# Patient Record
Sex: Female | Born: 1965 | Race: White | Hispanic: No | Marital: Married | Smoking: Never smoker
Health system: Southern US, Community
[De-identification: ages and names within clinical notes are randomized; demographics above are authoritative.]

## PROBLEM LIST (undated history)

## (undated) DIAGNOSIS — E039 Hypothyroidism, unspecified: Secondary | ICD-10-CM

## (undated) DIAGNOSIS — F329 Major depressive disorder, single episode, unspecified: Secondary | ICD-10-CM

## (undated) DIAGNOSIS — F411 Generalized anxiety disorder: Secondary | ICD-10-CM

## (undated) DIAGNOSIS — M79673 Pain in unspecified foot: Secondary | ICD-10-CM

## (undated) DIAGNOSIS — F909 Attention-deficit hyperactivity disorder, unspecified type: Secondary | ICD-10-CM

## (undated) DIAGNOSIS — I10 Essential (primary) hypertension: Principal | ICD-10-CM

## (undated) HISTORY — DX: Attention-deficit hyperactivity disorder, unspecified type: F90.9

## (undated) HISTORY — DX: Pain in unspecified foot: M79.673

## (undated) HISTORY — DX: Essential (primary) hypertension: I10

## (undated) HISTORY — DX: Generalized anxiety disorder: F41.1

## (undated) HISTORY — DX: Major depressive disorder, single episode, unspecified: F32.9

## (undated) HISTORY — DX: Hypothyroidism, unspecified: E03.9

---

## 1999-02-17 ENCOUNTER — Other Ambulatory Visit: Admission: RE | Admit: 1999-02-17 | Discharge: 1999-02-17 | Payer: Self-pay

## 1999-08-27 ENCOUNTER — Inpatient Hospital Stay (HOSPITAL_COMMUNITY): Admission: AD | Admit: 1999-08-27 | Discharge: 1999-08-29 | Payer: Self-pay | Admitting: Obstetrics & Gynecology

## 1999-10-09 ENCOUNTER — Other Ambulatory Visit: Admission: RE | Admit: 1999-10-09 | Discharge: 1999-10-09 | Payer: Self-pay | Admitting: Obstetrics & Gynecology

## 2002-02-02 ENCOUNTER — Encounter: Admission: RE | Admit: 2002-02-02 | Discharge: 2002-02-02 | Payer: Self-pay | Admitting: Obstetrics & Gynecology

## 2002-02-02 ENCOUNTER — Encounter: Payer: Self-pay | Admitting: Obstetrics & Gynecology

## 2002-02-22 ENCOUNTER — Ambulatory Visit (HOSPITAL_COMMUNITY): Admission: RE | Admit: 2002-02-22 | Discharge: 2002-02-22 | Payer: Self-pay | Admitting: Obstetrics & Gynecology

## 2002-02-22 ENCOUNTER — Encounter (INDEPENDENT_AMBULATORY_CARE_PROVIDER_SITE_OTHER): Payer: Self-pay

## 2002-05-11 ENCOUNTER — Other Ambulatory Visit: Admission: RE | Admit: 2002-05-11 | Discharge: 2002-05-11 | Payer: Self-pay | Admitting: Obstetrics & Gynecology

## 2002-09-20 ENCOUNTER — Encounter (INDEPENDENT_AMBULATORY_CARE_PROVIDER_SITE_OTHER): Payer: Self-pay

## 2002-09-20 ENCOUNTER — Observation Stay (HOSPITAL_COMMUNITY): Admission: RE | Admit: 2002-09-20 | Discharge: 2002-09-21 | Payer: Self-pay | Admitting: Obstetrics & Gynecology

## 2004-08-24 ENCOUNTER — Ambulatory Visit (HOSPITAL_COMMUNITY): Admission: RE | Admit: 2004-08-24 | Discharge: 2004-08-24 | Payer: Self-pay | Admitting: Obstetrics & Gynecology

## 2004-10-08 ENCOUNTER — Ambulatory Visit: Payer: Self-pay | Admitting: Internal Medicine

## 2005-01-01 ENCOUNTER — Ambulatory Visit: Payer: Self-pay | Admitting: Internal Medicine

## 2005-11-16 ENCOUNTER — Ambulatory Visit: Payer: Self-pay | Admitting: Internal Medicine

## 2006-03-24 ENCOUNTER — Ambulatory Visit: Payer: Self-pay | Admitting: Internal Medicine

## 2006-04-18 ENCOUNTER — Ambulatory Visit: Payer: Self-pay | Admitting: Internal Medicine

## 2006-10-04 ENCOUNTER — Ambulatory Visit: Payer: Self-pay | Admitting: Internal Medicine

## 2007-02-07 ENCOUNTER — Ambulatory Visit: Payer: Self-pay | Admitting: Internal Medicine

## 2007-06-02 ENCOUNTER — Telehealth (INDEPENDENT_AMBULATORY_CARE_PROVIDER_SITE_OTHER): Payer: Self-pay

## 2007-06-08 ENCOUNTER — Encounter: Payer: Self-pay | Admitting: Internal Medicine

## 2007-06-08 DIAGNOSIS — F329 Major depressive disorder, single episode, unspecified: Secondary | ICD-10-CM

## 2007-06-08 DIAGNOSIS — E039 Hypothyroidism, unspecified: Secondary | ICD-10-CM

## 2007-06-08 DIAGNOSIS — F3289 Other specified depressive episodes: Secondary | ICD-10-CM

## 2007-06-08 DIAGNOSIS — F411 Generalized anxiety disorder: Secondary | ICD-10-CM

## 2007-06-08 HISTORY — DX: Other specified depressive episodes: F32.89

## 2007-06-08 HISTORY — DX: Major depressive disorder, single episode, unspecified: F32.9

## 2007-06-08 HISTORY — DX: Hypothyroidism, unspecified: E03.9

## 2007-06-08 HISTORY — DX: Generalized anxiety disorder: F41.1

## 2007-11-09 ENCOUNTER — Telehealth: Payer: Self-pay | Admitting: Internal Medicine

## 2008-02-13 ENCOUNTER — Telehealth: Payer: Self-pay | Admitting: Internal Medicine

## 2008-02-19 ENCOUNTER — Ambulatory Visit: Payer: Self-pay | Admitting: Internal Medicine

## 2008-02-19 DIAGNOSIS — I10 Essential (primary) hypertension: Secondary | ICD-10-CM

## 2008-02-19 HISTORY — DX: Essential (primary) hypertension: I10

## 2008-03-18 ENCOUNTER — Ambulatory Visit: Payer: Self-pay | Admitting: Internal Medicine

## 2008-04-01 ENCOUNTER — Ambulatory Visit: Payer: Self-pay | Admitting: Internal Medicine

## 2008-04-01 LAB — CONVERTED CEMR LAB: TSH: 0.42 microintl units/mL (ref 0.35–5.50)

## 2008-04-08 ENCOUNTER — Telehealth: Payer: Self-pay | Admitting: Internal Medicine

## 2008-08-21 ENCOUNTER — Telehealth: Payer: Self-pay | Admitting: Internal Medicine

## 2008-10-01 ENCOUNTER — Telehealth: Payer: Self-pay | Admitting: Internal Medicine

## 2008-10-28 ENCOUNTER — Telehealth: Payer: Self-pay | Admitting: Internal Medicine

## 2009-06-13 ENCOUNTER — Telehealth: Payer: Self-pay | Admitting: Internal Medicine

## 2009-06-21 ENCOUNTER — Telehealth: Payer: Self-pay | Admitting: Family Medicine

## 2010-02-23 ENCOUNTER — Telehealth (INDEPENDENT_AMBULATORY_CARE_PROVIDER_SITE_OTHER): Payer: Self-pay | Admitting: *Deleted
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
## 2010-12-22 ENCOUNTER — Encounter: Payer: Self-pay | Admitting: Internal Medicine

## 2010-12-22 ENCOUNTER — Ambulatory Visit (INDEPENDENT_AMBULATORY_CARE_PROVIDER_SITE_OTHER): Payer: 59 | Admitting: Internal Medicine

## 2010-12-22 DIAGNOSIS — J069 Acute upper respiratory infection, unspecified: Secondary | ICD-10-CM

## 2010-12-22 DIAGNOSIS — E039 Hypothyroidism, unspecified: Secondary | ICD-10-CM

## 2010-12-22 DIAGNOSIS — I10 Essential (primary) hypertension: Secondary | ICD-10-CM

## 2010-12-22 MED ORDER — HYDROCODONE-HOMATROPINE 5-1.5 MG/5ML PO SYRP: 5.0000 mL | ORAL_SOLUTION | Freq: Four times a day (QID) | ORAL | Status: AC | PRN

## 2010-12-22 NOTE — Progress Notes (Signed)
  Subjective:    Patient ID: Tanya Mcguire, female    DOB: 1966/07/11, 45 y.o.   MRN: [id number]  HPI  45 year old patient with a finding history of sore throat.  That has large, resolved.  She has persistent nonproductive cough, hoarseness, and general sense of unwellness.  She has had some anterior chest pain, associated with the coughing.  No fever, chills, or significant sputum production.  She does have some mild postnasal drip.  She has treated hypertension, which has been stable    Review of Systems  Constitutional: Positive for fatigue.  HENT: Positive for rhinorrhea and postnasal drip. Negative for hearing loss, congestion, sore throat, dental problem, sinus pressure and tinnitus.   Eyes: Negative for pain, discharge and visual disturbance.  Respiratory: Positive for cough. Negative for shortness of breath.   Cardiovascular: Negative for chest pain, palpitations and leg swelling.  Gastrointestinal: Negative for nausea, vomiting, abdominal pain, diarrhea, constipation, blood in stool and abdominal distention.  Genitourinary: Negative for dysuria, urgency, frequency, hematuria, flank pain, vaginal bleeding, vaginal discharge, difficulty urinating, vaginal pain and pelvic pain.  Musculoskeletal: Negative for joint swelling, arthralgias and gait problem.  Skin: Negative for rash.  Neurological: Negative for dizziness, syncope, speech difficulty, weakness, numbness and headaches.  Hematological: Negative for adenopathy.  Psychiatric/Behavioral: Negative for behavioral problems, dysphoric mood and agitation. The patient is not nervous/anxious.        Objective:   Physical Exam  Constitutional: She is oriented to person, place, and time. She appears well-developed and well-nourished. No distress.  HENT:  Head: Normocephalic.  Right Ear: External ear normal.  Left Ear: External ear normal.  Mouth/Throat: No oropharyngeal exudate.       Mild erythema of the oropharynx  Eyes:  Conjunctivae and EOM are normal. Pupils are equal, round, and reactive to light.  Neck: Normal range of motion. Neck supple. Thyromegaly present.  Cardiovascular: Normal rate, regular rhythm, normal heart sounds and intact distal pulses.   Pulmonary/Chest: Effort normal and breath sounds normal.  Abdominal: Soft. Bowel sounds are normal. She exhibits no mass. There is no tenderness.  Musculoskeletal: Normal range of motion.  Lymphadenopathy:    She has no cervical adenopathy.  Neurological: She is alert and oriented to person, place, and time.  Skin: Skin is warm and dry. No rash noted.  Psychiatric: She has a normal mood and affect. Her behavior is normal.          Assessment & Plan:  Viral URI Hypothyroidism Hypertension stable  Will treat  symptomatically with Aleve, and anti-tussis

## 2010-12-22 NOTE — Patient Instructions (Signed)
Get plenty of rest, Drink lots of  clear liquids, and use Tylenol or ibuprofen for fever and discomfort.    Call or return to clinic prn if these symptoms worsen or fail to improve as anticipated.  

## 2011-02-09 ENCOUNTER — Telehealth: Payer: Self-pay | Admitting: *Deleted

## 2011-02-09 MED ORDER — SULFACETAMIDE SODIUM 10 % OP SOLN: 2.0000 [drp] | Freq: Four times a day (QID) | OPHTHALMIC | Status: AC

## 2011-02-09 NOTE — Telephone Encounter (Signed)
[place] the beach and would like Dr. Kirtland Bouchard to treat her for pink eye.

## 2011-02-09 NOTE — Telephone Encounter (Signed)
Generic bleph 10  2 drops every 6 hrs  10 cc

## 2011-02-09 NOTE — Telephone Encounter (Signed)
Attempt to call cell # - no ans no mach  - calling in eye med

## 2011-03-17 ENCOUNTER — Encounter: Payer: Self-pay | Admitting: Internal Medicine

## 2011-03-17 ENCOUNTER — Ambulatory Visit (INDEPENDENT_AMBULATORY_CARE_PROVIDER_SITE_OTHER): Payer: 59 | Admitting: Internal Medicine

## 2011-03-17 DIAGNOSIS — E039 Hypothyroidism, unspecified: Secondary | ICD-10-CM

## 2011-03-17 DIAGNOSIS — I10 Essential (primary) hypertension: Secondary | ICD-10-CM

## 2011-03-17 DIAGNOSIS — F909 Attention-deficit hyperactivity disorder, unspecified type: Secondary | ICD-10-CM | POA: Insufficient documentation

## 2011-03-17 MED ORDER — AMPHETAMINE-DEXTROAMPHET ER 30 MG PO CP24: 30.0000 mg | ORAL_CAPSULE | ORAL | Status: DC

## 2011-03-17 MED ORDER — LISINOPRIL-HYDROCHLOROTHIAZIDE 20-25 MG PO TABS: 1.0000 | ORAL_TABLET | Freq: Every day | ORAL | Status: DC

## 2011-03-17 NOTE — Progress Notes (Signed)
  Subjective:    Patient ID: Tanya Mcguire, female    DOB: 1966/03/31, 45 y.o.   MRN: [id number]  HPI 45 year old patient who is seen today for followup. She has a history of hypothyroidism. Her chief complaint is muscle aches and pains. This has been intermittent problem for some time. There is a partial response to aspirin and other anti-inflammatories. She has hypertension but has not been taking her blood pressure medication regularly. When seen here 3 months ago she had been off her thyroid supplement. She has a history of ADD but has not been on Adderall in a number of years she is requesting a refill. She has a history of exogenous obesity and has not been very effective at weight loss. Until recently,  has been eating out often    Review of Systems  Constitutional: Negative.   HENT: Negative for hearing loss, congestion, sore throat, rhinorrhea, dental problem, sinus pressure and tinnitus.   Eyes: Negative for pain, discharge and visual disturbance.  Respiratory: Negative for cough and shortness of breath.   Cardiovascular: Negative for chest pain, palpitations and leg swelling.  Gastrointestinal: Negative for nausea, vomiting, abdominal pain, diarrhea, constipation, blood in stool and abdominal distention.  Genitourinary: Negative for dysuria, urgency, frequency, hematuria, flank pain, vaginal bleeding, vaginal discharge, difficulty urinating, vaginal pain and pelvic pain.  Musculoskeletal: Negative for joint swelling, arthralgias and gait problem.  Skin: Negative for rash.  Neurological: Negative for dizziness, syncope, speech difficulty, weakness, numbness and headaches.  Hematological: Negative for adenopathy.  Psychiatric/Behavioral: Negative for behavioral problems, dysphoric mood and agitation. The patient is not nervous/anxious.        Objective:   Physical Exam  Constitutional: She is oriented to person, place, and time. She appears well-developed and well-nourished. No  distress.       Obese blood pressure 160/100  HENT:  Head: Normocephalic.  Right Ear: External ear normal.  Left Ear: External ear normal.  Mouth/Throat: Oropharynx is clear and moist.  Eyes: Conjunctivae and EOM are normal. Pupils are equal, round, and reactive to light.  Neck: Normal range of motion. Neck supple. No thyromegaly present.  Cardiovascular: Normal rate, regular rhythm, normal heart sounds and intact distal pulses.   Pulmonary/Chest: Effort normal and breath sounds normal. No respiratory distress. She has no wheezes.  Abdominal: Soft. Bowel sounds are normal. She exhibits no mass. There is no tenderness.  Musculoskeletal: Normal range of motion.  Lymphadenopathy:    She has no cervical adenopathy.  Neurological: She is alert and oriented to person, place, and time.  Skin: Skin is warm and dry. No rash noted.  Psychiatric: She has a normal mood and affect. Her behavior is normal.          Assessment & Plan:   Hypertension. Poor control off medication. We'll resume lisinopril hydrochlorothiazide and discontinued Benicar hydrochlorothiazide which she states she does not tolerate well Hypothyroidism. We'll check a TSH ADD we'll resume Adderall

## 2011-03-17 NOTE — Patient Instructions (Signed)

## 2011-03-19 NOTE — Discharge Summary (Signed)
   [name], Tanya Mcguire                        ACCOUNT NO.:  [id number]   MEDICAL RECORD NO.:  [id number]                   PATIENT TYPE:  OBV   LOCATION:  9314                                 FACILITY:  [facility]   PHYSICIAN:  Freddy Finner, M.D.                DATE OF BIRTH:  [birth year]   DATE OF ADMISSION:  09/20/2002  DATE OF DISCHARGE:  09/21/2002                                 DISCHARGE SUMMARY   DISCHARGE DIAGNOSES:  1. Uterine enlargement to 131 g.  2. Serosal endometriosis.  3. Clinical presentation consistent with adenomyosis, not confirmed     histologically, although of note is the enlarged uterus.  4. Clinical symptoms of menorrhagia, severe dysmenorrhea.   OPERATIVE PROCEDURE:  Laparoscopically assisted vaginal hysterectomy.   SURGEON:  Freddy Finner, M.D.   POSTOPERATIVE COMPLICATIONS:  None.   DISPOSITION:  The patient is in satisfactory, improved condition at the time  of her discharge.  She was to have no heavy lifting or vaginal entry.  She  is to have progressively increasing physical activity, a regular diet.  She  is to follow up in the office in two weeks.  She is to take Percocet and/or  Motrin as needed for postoperative pain.  She is to call for fever, severe  pain, or heavy bleeding.   HISTORY OF PRESENT ILLNESS:  Details of the present illness are recorded in  the admission note.  The patient's clinical symptoms are noted above in the  discharge diagnoses.  Her uterus was considered enlarged to [redacted] weeks  gestational size, although no definite nodules were palpable.   LABORATORY DATA:  During this admission includes a hemoglobin of 11.7 on  admission, 9.1 postoperatively.  Admission prothrombin time and PTT were  normal.  Admission urinalysis was normal.   HOSPITAL COURSE:  The patient was admitted on the morning of surgery.  The  above described procedure was accomplished without difficulty.  Her  postoperative course was uncomplicated.  She  remained afebrile throughout  her hospital stay.  By the evening of the first postoperative day she was  having adequate bowel and bladder function.  She was ambulating without  difficulty.  She remained afebrile throughout her hospital stay.  She was  discharged home with disposition noted above for follow-up in the office in  two weeks.                                               Freddy Finner, M.D.    WRN/MEDQ  D:  10/22/2002  T:  10/23/2002  Job:  [id number]

## 2011-03-19 NOTE — H&P (Signed)
Tanya Mcguire, TONNER                        ACCOUNT NO.:  [id number]   MEDICAL RECORD NO.:  [id number]                   PATIENT TYPE:  AMB   LOCATION:  SDC                                  FACILITY:  [facility]   PHYSICIAN:  Freddy Finner, M.D.                DATE OF BIRTH:  1966/01/06   DATE OF ADMISSION:  09/20/2002  DATE OF DISCHARGE:                                HISTORY & PHYSICAL   ADMITTING DIAGNOSES:  Uterine enlargement.  Findings and clinical symptoms  consistent with adenomyosis including menorrhagia, dysmenorrhea.   HISTORY OF PRESENT ILLNESS:  the patient is a 45 year old white married  female gravida 5 para 3 who has a long history of very heavy menses,  dysmenorrhea, and dyspareunia.  On examination she has bene known to have  boggy enlargement of the uterus of approximately [redacted] weeks gestational size.  The uterus is tender.  There are no definite nodules.  There are no palpable  adnexal masses.  Based on her long-standing history, her completion of  childbearing, she is now admitted for laparoscopically assisted vaginal  hysterectomy.   REVIEW OF SYSTEMS:  Otherwise negative.  No cardiopulmonary, GI, GU  complaints.   PAST MEDICAL HISTORY:  The patient has no known significant medical  illnesses.   ALLERGIES:  She has no known allergies to medications.   OB HISTORY:  She has given birth to three children vaginally.  She had a D&C  for a missed AB in the spring of 2003.  She had spontaneous complete AB in  the last seven to 10 days.  She has never had a blood transfusion.  She does  not use cigarettes.   FAMILY HISTORY:  Noncontributory.   PHYSICAL EXAMINATION:  HEENT:  Grossly within normal limits.  NECK:  Thyroid gland was not palpably enlarged.  VITAL SIGNS:  Blood pressure in the office was 128/82.  Hemoglobin on the  day prior to admission was 11.8.  CHEST:  Clear to auscultation.  HEART:  Normal sinus rhythm.  There is a grade 1/6 early systolic  murmur.  There are no other murmurs.  No rubs.  No gallops.  BREASTS:  Considered to be normal.  No masses.  No skin change.  No nipple  discharge.  ABDOMEN:  Soft, nontender without appreciable organomegaly or palpable  masses.  PELVIC:  As noted above.  EXTREMITIES:  Without clubbing, cyanosis, edema.   ASSESSMENT:  1. Uterine enlargement.  2.     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  3. Clinical symptoms of dysmenorrhea, severe menorrhagia, and dyspareunia.   PLAN:  Laparoscopically assisted vaginal hysterectomy.                                               Freddy Finner, M.D.  WRN/MEDQ  D:  09/19/2002  T:  09/19/2002  Job:  [id number]

## 2011-03-19 NOTE — Op Note (Signed)
NAMEELISIA, STEPP NO.:  [id number]   MEDICAL RECORD NO.:  [id number]                   PATIENT TYPE:  OBV   LOCATION:  9399                                 FACILITY:  [facility]   PHYSICIAN:  Freddy Finner, M.D.                DATE OF BIRTH:  [birth year]   DATE OF PROCEDURE:  09/20/2002  DATE OF DISCHARGE:                                 OPERATIVE REPORT   PREOPERATIVE DIAGNOSIS:  Uterine enlargement, suspected adenomyosis.   POSTOPERATIVE DIAGNOSIS:  Uterine enlargement, suspected adenomyosis.   OPERATIVE PROCEDURE:  Laparoscopy-assisted vaginal hysterectomy.   SURGEON:  Freddy Finner, M.D.   ASSISTANT:  Stann Mainland. Vincente Poli, M.D.   ESTIMATED INTRAOPERATIVE BLOOD LOSS:  200 cc.   INTRAOPERATIVE COMPLICATIONS:  None.   DESCRIPTION OF PROCEDURE:  The details of the present admission are recorded  in the admission note.  The patient was admitted on the morning of surgery.  She was brought to the operating room after receiving a gram of Ceftin IV  and being placed in PIS hose.  She was placed under adequate general  endotracheal anesthesia, placed in the dorsal lithotomy position using the  [employer] system.  Betadine prep of abdomen, perineum, and vagina was  carried out in the usual fashion with scrub followed by solution.  The  bladder was evacuated with a Robinson catheter.  The cervix was visualized  and a Hulka tenaculum attached without difficulty.  Sterile drapes were  applied.  Two small incisions were made - one at the umbilicus and one just  above the symphysis.  Through the umbilical incision a 10/11 disposable  trocar was introduced without difficulty and with no evidence of injury on  entry.  This was done while elevating the anterior abdominal wall manually.  Under direct visualization a 5 mm trocar was placed through the lower  incision.  A blunt probe was used through this port.  A systematic  examination of pelvic and lower  abdominal contents was carried out and no  visual abnormalities were noted except the uterus.  Photographs were made  and retained in the office record.  Using the Gyrus laparoscopic tripolar  device the proximal broad ligaments were taken in successive bites and  sharply divided.  This was done by placing the ovary and/or the uterus under  traction.  This was carried down to a level just above the vessels on each  side.  Attention was then turned vaginally.  A posterior lighted vaginal  retractor was placed.  The Hulka tenaculum was removed, a Jacobs tenaculum  was applied.  A colpotomy incision was made rotating the mucosa with an  Allis.  The cervix was circumscribed with a scalpel.  The uterosacrals were  taken with curved Heaneys, divided sharply, and ligated with 0 Vicryl in the  Heaney fashion.  Bladder poles were taken separately, divided sharply, and  ligated with 0  Monocryl.  The anterior peritoneum was then entered.  The  cardinal ligament pedicles were taken with curved Heaneys, divided sharply,  and ligated with 0 Monocryl.  The vessel pedicles were then taken with  curved Heaneys, divided sharply, and ligated with 0 Monocryl.  The uterus  then was delivered through the introitus without difficulty.  The anus and  the vagina were anchored to uterosacrals with mattress suture of 0 Monocryl.  The uterosacrals were plicated to the posterior peritoneum and then the  uterosacrals closed with an interrupted 0 Monocryl suture.  The cuff was  closed vertically with figure-of-eights of  0 Monocryl.  A Foley catheter was placed.  Attention was redirected  abdominally.  Using the Ne[place]hat irrigating system thorough the lateral trocar  sleeve, careful examination of all pedicles was carried out.  Minimal scant  oozing was noted and was controlled with the Gyrus bipolar system.  With  complete hemostasis the irrigation solution was aspirated from the abdomen.  All instruments were removed.   The skin incisions were closed with  interrupted subcuticular stitches of 3-0 Dexon, 0.5% Marcaine was injected  through the incision sites for analgesia, Steri-Strips were applied to the  lower incision.  The patient was awakened and taken to recovery in good  condition.                                               Freddy Finner, M.D.    WRN/MEDQ  D:  09/20/2002  T:  09/20/2002  Job:  [id number]

## 2011-03-19 NOTE — Op Note (Signed)
[facility]  Patient:    Tanya Mcguire, Tanya Mcguire Visit Number: [id number] MRN: [id number]          Service Type: DSU Location: [facility] Attending Physician:  Minette Headland Dictated by:   Freddy Finner, M.D. Proc. Date: 02/22/02 Admit Date:  02/22/2002                             Operative Report  PREOPERATIVE DIAGNOSIS:       Incomplete spontaneous abortion.  POSTOPERATIVE DIAGNOSIS:      Incomplete spontaneous abortion.  OPERATION PERFORMED:          D&C.  SURGEON:                      Freddy Finner, M.D.  ANESTHESIA:                   Intravenous sedation, paracervical block.  INTRAOPERATIVE COMPLICATIONS: None.  ESTIMATED BLOOD LOSS:         Less than 10 cc.  INDICATIONS FOR PROCEDURE:    The patient is a 45 year old who had a documented intrauterine pregnancy by ultrasound and by serum quantitative HCG. She persisted in having bleeding and over a period of approximately one week failed to clear all the material from the uterus. She continued to have bleeding and for that reason, she is now admitted for a D&C.  DESCRIPTION OF PROCEDURE:     She was admitted on the morning of surgery, taken to the operating room, placed under adequate intravenous sedation, and placed in the dorsal lithotomy position using the [facility] stirrup system. Betadine prep was carried out. A bivalve speculum was used to visualize the cervix which was grasped with a single tooth tenaculum on the anterior lip. Paracervical block was placed using 10 cc of 1% plain lidocaine. The cervix was progressively dilated to 23 with Shawnie Pons. A Heaney curette was introduced and gentle but thorough curettage was carried out followed by exploration with Randall stone forceps. A moderate amount of material was obtained and submitted for histologic examination. The patient was then taken to recovery in good condition. She will be discharged with routine outpatient surgical instructions  for follow-up in the office in approximately 7-10 days. She is to call for fever, severe pain, and for heavy bleeding. She is to take ibuprofen or Tylenol as needed for postoperative pain. Dictated by:   Freddy Finner, M.D. Attending Physician:  Minette Headland DD:  02/22/02 TD:  02/22/02 Job: [id number]

## 2011-04-01 ENCOUNTER — Telehealth: Payer: Self-pay | Admitting: *Deleted

## 2011-04-01 NOTE — Telephone Encounter (Signed)
Pt would like thyroid results, and may leave a message on her machine.

## 2011-04-01 NOTE — Telephone Encounter (Signed)
ATTEMPT TO CALL - ANS MACH ON CELL - INFORMED LAB NORMAL

## 2011-04-01 NOTE — Telephone Encounter (Signed)
Thyroid test was normal. Please inform patient

## 2011-04-16 ENCOUNTER — Telehealth: Payer: Self-pay

## 2011-04-16 ENCOUNTER — Ambulatory Visit: Payer: 59 | Admitting: Internal Medicine

## 2011-04-16 DIAGNOSIS — Z0289 Encounter for other administrative examinations: Secondary | ICD-10-CM

## 2011-04-16 NOTE — Telephone Encounter (Signed)
Attempt to call - missed 1 mos rov - ans mach at hm# - LMTCB . KIK

## 2011-05-24 ENCOUNTER — Telehealth: Payer: Self-pay | Admitting: Internal Medicine

## 2011-05-24 NOTE — Telephone Encounter (Signed)
Please advise 

## 2011-05-24 NOTE — Telephone Encounter (Signed)
Pt lost her 2 rxs for her Adderall. She filled her last one on 03-17-2011. She would like a refill.

## 2011-05-24 NOTE — Telephone Encounter (Signed)
ok 

## 2011-05-26 MED ORDER — AMPHETAMINE-DEXTROAMPHET ER 30 MG PO CP24: 30.0000 mg | ORAL_CAPSULE | ORAL | Status: DC

## 2011-05-26 NOTE — Telephone Encounter (Signed)
Attempt to call - VM - LMTCB if questions - rx's ready for pick up

## 2011-05-26 NOTE — Telephone Encounter (Signed)
rx's ready for pick up 

## 2012-01-07 ENCOUNTER — Other Ambulatory Visit: Payer: Self-pay | Admitting: Internal Medicine

## 2012-03-06 ENCOUNTER — Ambulatory Visit (INDEPENDENT_AMBULATORY_CARE_PROVIDER_SITE_OTHER): Payer: 59 | Admitting: Family Medicine

## 2012-03-06 ENCOUNTER — Encounter: Payer: Self-pay | Admitting: Family Medicine

## 2012-03-06 VITALS — BP 130/82 | Temp 98.2°F | Wt 191.0 lb

## 2012-03-06 DIAGNOSIS — M549 Dorsalgia, unspecified: Secondary | ICD-10-CM

## 2012-03-06 DIAGNOSIS — M546 Pain in thoracic spine: Secondary | ICD-10-CM

## 2012-03-06 MED ORDER — LORAZEPAM 0.5 MG PO TABS: 0.5000 mg | ORAL_TABLET | Freq: Two times a day (BID) | ORAL | Status: AC | PRN

## 2012-03-06 MED ORDER — CYCLOBENZAPRINE HCL 5 MG PO TABS: 5.0000 mg | ORAL_TABLET | Freq: Three times a day (TID) | ORAL | Status: AC | PRN
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
## 2012-03-15 ENCOUNTER — Telehealth: Payer: Self-pay | Admitting: Internal Medicine

## 2012-03-15 MED ORDER — AMPHETAMINE-DEXTROAMPHET ER 30 MG PO CP24: 30.0000 mg | ORAL_CAPSULE | ORAL | Status: DC

## 2012-03-15 NOTE — Telephone Encounter (Signed)
Attempt to call- VM - LMTCB if questions - rx's ready for pick up - will need rov for any future med request

## 2012-03-15 NOTE — Telephone Encounter (Signed)
Ok 3 months;  needs rov

## 2012-03-15 NOTE — Telephone Encounter (Signed)
Pt called and said that she is needing to get new written scripts for amphetamine-dextroamphetamine (ADDERALL XR, 30MG ,) 30 MG 24 hr capsule. Pt had one of the scripts filled, but has lost the other 2 written scripts.

## 2012-03-15 NOTE — Telephone Encounter (Signed)
You last saw 03/2011 - we last gave rx's for adderall 05/26/11 - she called because she had lost - wanted new ones - we filled - no request to fill since then - please advise rov? Ok to fill? 1 rx  OR 3rx's?

## 2012-04-23 ENCOUNTER — Emergency Department (HOSPITAL_BASED_OUTPATIENT_CLINIC_OR_DEPARTMENT_OTHER): Payer: BC Managed Care – PPO

## 2012-04-23 ENCOUNTER — Emergency Department (HOSPITAL_BASED_OUTPATIENT_CLINIC_OR_DEPARTMENT_OTHER)
Admission: EM | Admit: 2012-04-23 | Discharge: 2012-04-23 | Disposition: A | Discharge status: Home / Self Care | Payer: BC Managed Care – PPO | Attending: Emergency Medicine | Admitting: Emergency Medicine

## 2012-04-23 ENCOUNTER — Encounter (HOSPITAL_BASED_OUTPATIENT_CLINIC_OR_DEPARTMENT_OTHER): Payer: Self-pay | Admitting: *Deleted

## 2012-04-23 DIAGNOSIS — S93409A Sprain of unspecified ligament of unspecified ankle, initial encounter: Secondary | ICD-10-CM | POA: Insufficient documentation

## 2012-04-23 DIAGNOSIS — F411 Generalized anxiety disorder: Secondary | ICD-10-CM | POA: Insufficient documentation

## 2012-04-23 DIAGNOSIS — X500XXA Overexertion from strenuous movement or load, initial encounter: Secondary | ICD-10-CM | POA: Insufficient documentation

## 2012-04-23 DIAGNOSIS — I10 Essential (primary) hypertension: Secondary | ICD-10-CM | POA: Insufficient documentation

## 2012-04-23 DIAGNOSIS — F329 Major depressive disorder, single episode, unspecified: Secondary | ICD-10-CM | POA: Insufficient documentation

## 2012-04-23 DIAGNOSIS — F909 Attention-deficit hyperactivity disorder, unspecified type: Secondary | ICD-10-CM | POA: Insufficient documentation

## 2012-04-23 DIAGNOSIS — F3289 Other specified depressive episodes: Secondary | ICD-10-CM | POA: Insufficient documentation

## 2012-04-23 DIAGNOSIS — Y92009 Unspecified place in unspecified non-institutional (private) residence as the place of occurrence of the external cause: Secondary | ICD-10-CM | POA: Insufficient documentation

## 2012-04-23 DIAGNOSIS — S93401A Sprain of unspecified ligament of right ankle, initial encounter: Secondary | ICD-10-CM

## 2012-04-23 DIAGNOSIS — E039 Hypothyroidism, unspecified: Secondary | ICD-10-CM | POA: Insufficient documentation

## 2012-04-23 MED ORDER — HYDROCODONE-ACETAMINOPHEN 5-325 MG PO TABS: 2.0000 | ORAL_TABLET | ORAL | Status: AC | PRN

## 2012-04-23 MED ORDER — HYDROCODONE-ACETAMINOPHEN 5-325 MG PO TABS
2.0000 | ORAL_TABLET | Freq: Once | ORAL | Status: AC
  Administered 2012-04-23: 2 via ORAL
  Filled 2012-04-23: qty 2

## 2012-04-23 NOTE — ED Provider Notes (Signed)
History     CSN: [id number]  Arrival date & time 04/23/12  4098   First MD Initiated Contact with Patient 04/23/12 1935      Chief Complaint  Patient presents with  . Ankle Injury    (Consider location/radiation/quality/duration/timing/severity/associated sxs/prior treatment) Patient is a 46 y.o. female presenting with ankle pain. The history is provided by the patient. No language interpreter was used.  Ankle Pain  The incident occurred less than 1 hour ago. The incident occurred at home. There was no injury mechanism. The pain is present in the right ankle. The quality of the pain is described as sharp. The pain is at a severity of 6/10. The pain is moderate. The pain has been constant since onset. She reports no foreign bodies present. Nothing aggravates the symptoms. She has tried nothing for the symptoms.  Pt complains of pain in her right ankle.  Pt reports she twisted her ankle on a step yesterday.   Pt complains of swelling and pain.   Pt has pain with walking  Past Medical History  Diagnosis Date  . ANXIETY 06/08/2007  . DEPRESSION 06/08/2007  . HYPERTENSION, BENIGN ESSENTIAL 02/19/2008  . HYPOTHYROIDISM 06/08/2007  . ADD (attention deficit disorder with hyperactivity)     History reviewed. No pertinent past surgical history.  No family history on file.  History  Substance Use Topics  . Smoking status: Never Smoker   . Smokeless tobacco: Not on file  . Alcohol Use: Yes     occasionally    OB History    Grav Para Term Preterm Abortions TAB SAB Ect Mult Living                  Review of Systems  Musculoskeletal: Positive for joint swelling.  All other systems reviewed and are negative.    Allergies  Review of patient's allergies indicates no known allergies.  Home Medications   Current Outpatient Rx  Name Route Sig Dispense Refill  . AMPHETAMINE-DEXTROAMPHET ER 30 MG PO CP24 Oral Take 30 mg by mouth every morning. Patient uses 15 milligrams of this  medication when she uses it.    [name] THYROID 30 MG PO TABS  TAKE 5 TABLETS ONCE A DAY. 150 tablet 3  . LISINOPRIL-HYDROCHLOROTHIAZIDE 20-25 MG PO TABS Oral Take 1 tablet by mouth daily. 90 tablet 4    BP 165/85  Pulse 65  Temp 98.8 F (37.1 C) (Oral)  Resp 16  Ht 5\' 6"  (1.676 m)  Wt 190 lb (86.183 kg)  BMI 30.67 kg/m2  SpO2 98%  Physical Exam  Nursing note and vitals reviewed. Constitutional: She is oriented to person, place, and time. She appears well-developed and well-nourished.  Musculoskeletal: She exhibits tenderness.       Swollen tender laterl malleolus,  Bruised foot,  From  nv and ns intact  Neurological: She is alert and oriented to person, place, and time.  Skin: Skin is warm and dry.    ED Course  Procedures (including critical care time)  Labs Reviewed - No data to display Dg Ankle Complete Right  04/23/2012  *RADIOLOGY REPORT*  Clinical Data: Right ankle pain.  RIGHT ANKLE - COMPLETE 3+ VIEW  Comparison: None  Findings: The ankle mortise is maintained.  No acute ankle fracture.  No osteochondral lesion.  No definite joint effusion. The visualized mid and hind foot bony structures are intact.  IMPRESSION: No acute bony findings.  Original Report Authenticated By: P. Loralie Champagne, M.D.  1. Sprain of ankle, right       MDM  Pt placed in an aso and given crutches,  2 vcodin here,   Pt advised to follow up with Dr. Pearletha Forge for recheck in 3-4 days.         Lonia Skinner [place], [place] 04/23/12 2031  Lonia Skinner [place], [place] 04/23/12 2033

## 2012-04-23 NOTE — ED Provider Notes (Signed)
Medical screening examination/treatment/procedure(s) were performed by non-physician practitioner and as supervising physician I was immediately available for consultation/collaboration.   Kaidence Callaway, MD 04/23/12 2309 

## 2012-04-23 NOTE — ED Notes (Signed)
Walking down stairs and twisted her right ankle. Now c/o pain and swelling.

## 2012-04-23 NOTE — Discharge Instructions (Signed)
Ankle Sprain An ankle sprain is an injury to the strong, fibrous tissues (ligaments) that hold the bones of your ankle joint together.  CAUSES Ankle sprain usually is caused by a fall or by twisting your ankle. People who participate in sports are more prone to these types of injuries.  SYMPTOMS  Symptoms of ankle sprain include:  Pain in your ankle. The pain may be present at rest or only when you are trying to stand or walk.   Swelling.   Bruising. Bruising may develop immediately or within 1 to 2 days after your injury.   Difficulty standing or walking.  DIAGNOSIS  Your caregiver will ask you details about your injury and perform a physical exam of your ankle to determine if you have an ankle sprain. During the physical exam, your caregiver will press and squeeze specific areas of your foot and ankle. Your caregiver will try to move your ankle in certain ways. An X-ray exam may be done to be sure a bone was not broken or a ligament did not separate from one of the bones in your ankle (avulsion).  TREATMENT  Certain types of braces can help stabilize your ankle. Your caregiver can make a recommendation for this. Your caregiver may recommend the use of medication for pain. If your sprain is severe, your caregiver may refer you to a surgeon who helps to restore function to parts of your skeletal system (orthopedist) or a physical therapist. HOME CARE INSTRUCTIONS  Apply ice to your injury for 1 to 2 days or as directed by your caregiver. Applying ice helps to reduce inflammation and pain.  Put ice in a plastic bag.   Place a towel between your skin and the bag.   Leave the ice on for 15 to 20 minutes at a time, every 2 hours while you are awake.   Take over-the-counter or prescription medicines for pain, discomfort, or fever only as directed by your caregiver.   Keep your injured leg elevated, when possible, to lessen swelling.   If your caregiver recommends crutches, use them as  instructed. Gradually, put weight on the affected ankle. Continue to use crutches or a cane until you can walk without feeling pain in your ankle.   If you have a plaster splint, wear the splint as directed by your caregiver. Do not rest it on anything harder than a pillow the first 24 hours. Do not put weight on it. Do not get it wet. You may take it off to take a shower or bath.   You may have been given an elastic bandage to wear around your ankle to provide support. If the elastic bandage is too tight (you have numbness or tingling in your foot or your foot becomes cold and blue), adjust the bandage to make it comfortable.   If you have an air splint, you may blow more air into it or let air out to make it more comfortable. You may take your splint off at night and before taking a shower or bath.   Wiggle your toes in the splint several times per day if you are able.  SEEK MEDICAL CARE IF:   You have an increase in bruising, swelling, or pain.   Your toes feel cold.   Pain relief is not achieved with medication.  SEEK IMMEDIATE MEDICAL CARE IF: Your toes are numb or blue or you have severe pain. MAKE SURE YOU:   Understand these instructions.   Will watch your condition.     Will get help right away if you are not doing well or get worse.  Document Released: 10/18/2005 Document Revised: 10/07/2011 Document Reviewed: 05/22/2008 [employer] Patient Information 2012 [employer]. 

## 2012-04-25 ENCOUNTER — Telehealth (HOSPITAL_BASED_OUTPATIENT_CLINIC_OR_DEPARTMENT_OTHER): Payer: Self-pay | Admitting: *Deleted

## 2012-04-25 NOTE — ED Notes (Signed)
Patient called requesting a note to return to work on [facility] 04/27/12.  Chart reviewed with K. Sophia, PAC.  Note written to return to work on 04/27/12, to use crutches as needed until follow up with Dr. Pearletha Forge.  Continue to use ASO ankle support with tennis shoes for at least two weeks or until released by Dr. Pearletha Forge.  V/O K. Sophia, PAC.

## 2012-05-09 ENCOUNTER — Ambulatory Visit: Payer: 59 | Admitting: Internal Medicine

## 2012-09-25 ENCOUNTER — Ambulatory Visit (INDEPENDENT_AMBULATORY_CARE_PROVIDER_SITE_OTHER): Payer: BC Managed Care – PPO | Admitting: Family Medicine

## 2012-09-25 ENCOUNTER — Encounter: Payer: Self-pay | Admitting: Family Medicine

## 2012-09-25 VITALS — BP 122/80 | HR 102 | Temp 100.3°F | Wt 191.0 lb

## 2012-09-25 DIAGNOSIS — B349 Viral infection, unspecified: Secondary | ICD-10-CM

## 2012-09-25 DIAGNOSIS — J329 Chronic sinusitis, unspecified: Secondary | ICD-10-CM

## 2012-09-25 DIAGNOSIS — R509 Fever, unspecified: Secondary | ICD-10-CM

## 2012-09-25 DIAGNOSIS — B9789 Other viral agents as the cause of diseases classified elsewhere: Secondary | ICD-10-CM

## 2012-09-25 LAB — CBC WITH DIFFERENTIAL/PLATELET
Basophils Relative: 0.1 % (ref 0.0–3.0)
Eosinophils Relative: 0.1 % (ref 0.0–5.0)
HCT: 34.4 % — ABNORMAL LOW (ref 36.0–46.0)
Hemoglobin: 11.3 g/dL — ABNORMAL LOW (ref 12.0–15.0)
Lymphs Abs: 1.1 10*3/uL (ref 0.7–4.0)
MCV: 90.4 fl (ref 78.0–100.0)
Monocytes Absolute: 0.8 10*3/uL (ref 0.1–1.0)
RBC: 3.81 Mil/uL — ABNORMAL LOW (ref 3.87–5.11)
WBC: 15.1 10*3/uL — ABNORMAL HIGH (ref 4.5–10.5)

## 2012-09-25 LAB — COMPREHENSIVE METABOLIC PANEL
Alkaline Phosphatase: 87 U/L (ref 39–117)
BUN: 13 mg/dL (ref 6–23)
Glucose, Bld: 112 mg/dL — ABNORMAL HIGH (ref 70–99)
Sodium: 139 mEq/L (ref 135–145)
Total Bilirubin: 0.5 mg/dL (ref 0.3–1.2)
Total Protein: 7.6 g/dL (ref 6.0–8.3)

## 2012-09-25 MED ORDER — DOXYCYCLINE HYCLATE 100 MG PO TABS: 100.0000 mg | ORAL_TABLET | Freq: Two times a day (BID) | ORAL | Status: DC

## 2012-09-25 NOTE — Patient Instructions (Addendum)
-  start antibiotic today  -follow up in 48 hours if not much better - follow up sooner or see a doctor immediately if worsening or change in syptoms  -drink plenty of fluids  STOP taking tylenol, do NOT take over recommended dose of ibuprofen

## 2012-09-25 NOTE — Addendum Note (Signed)
Addended by: Azucena Freed on: 09/25/2012 05:13 PM   Modules accepted: Orders

## 2012-09-25 NOTE — Progress Notes (Addendum)
Chief Complaint  Patient presents with  . Fever    vomiting, some cough, body aches, chills, headache     HPI:   Started about one week ago Symptoms: fevers, body aches, chills, cough, HA a few times - now resolved, back is sore all over, sore throat, scratchy throat, vomitingx1 today, nausea, sinus pressure on R side -Denies: CP, SOB, runny nose, ear pain, tooth pain, current head or neck pain, joint pain, abd pain, urinary frequency or urgency, rash, tick bite, camping, walking in woods, has indoor dog with no ticks, vision changes, vaginal discharge, pelvic or abdominal pain -reports fevers initially around 100-101, but 102 to high of 103 last few days - alternating tylenol and ibuprofen -has been using tylenol and ibuprofen ever few days (sounds like 6000mg  of tylenol per day for 2 days), last dose 1.5 hours ago -Sick contacts: none, no mono, flu or strep exposure that she is aware of  ROS: See pertinent positives and negatives per HPI.  Past Medical History  Diagnosis Date  . ANXIETY 06/08/2007  . DEPRESSION 06/08/2007  . HYPERTENSION, BENIGN ESSENTIAL 02/19/2008  . HYPOTHYROIDISM 06/08/2007  . ADD (attention deficit disorder with hyperactivity)     No family history on file.  History   Social History  . Marital Status: Married    Spouse Name: N/A    Number of Children: N/A  . Years of Education: N/A   Social History Main Topics  . Smoking status: Never Smoker   . Smokeless tobacco: None  . Alcohol Use: Yes     Comment: occasionally  . Drug Use: None  . Sexually Active: None   Other Topics Concern  . None   Social History Narrative  . None    Current outpatient prescriptions:ARMOUR THYROID 30 MG tablet, TAKE 5 TABLETS ONCE A DAY., Disp: 150 tablet, Rfl: 3;  amphetamine-dextroamphetamine (ADDERALL XR) 30 MG 24 hr capsule, Take 30 mg by mouth every morning. Patient uses 15 milligrams of this medication when she uses it., Disp: , Rfl: ;  lisinopril-hydrochlorothiazide  (PRINZIDE,ZESTORETIC) 20-25 MG per tablet, Take 1 tablet by mouth daily., Disp: 90 tablet, Rfl: 4  EXAM:  Filed Vitals:   09/25/12 1418  BP: 122/80  Pulse: 102  Temp: 100.3 F (37.9 C)    There is no height on file to calculate BMI.  GENERAL: vitals reviewed and listed above, alert, oriented, appears well hydrated and in no acute distress  HEENT: atraumatic, conjunttiva clear, no obvious abnormalities on inspection of external nose and ears, ear canals and TMs normal, small amount of clear fluid R middle ear, nasal congestion with erythematous mucus membranes, PND, no sinus TTP  NECK: no obvious masses on inspection, few anterior cervical shotty nodes  LUNGS: clear to auscultation bilaterally, no wheezes, rales or rhonchi, good air movement  CV: HRRR, no peripheral edema  MS: moves all extremities without noticeable abnormality  ABD: BS+, soft, NTTP  PSYCH: pleasant and cooperative, no obvious depression or anxiety  ASSESSMENT AND PLAN:  Discussed the following assessment and plan:  1. Fever   2. ? Sinusitis   3. Viral syndrome    -pt appears well today with no fever > 100.4 on my exam and no focal findings on exam other then upper resp findings per above -discussed possible etiologies/workup for symptoms, likely viral infection versus sinusitis possible, and decided on the following: - will tx with doxy in case sinusitis or rare chance is RMSF though low risk for this -rapid flu and  rapid strep neg, CMP (due to large amount of tylenol use), CBC and HIV per pt permission pending -pt not worried for STIs, but has had new partner in last year and has wanted to have HIV checked for some time -taking excessive amount of tylenol because afraid of having any fever and thought it was safe since OTC - advised to stop tylenol and to always read instructions for OTC medications -Patient advised to return or notify a doctor immediately if symptoms worsen or persist or new concerns  arise. ED precautions. -follow up in 48 hours if symptoms not resolved  There are no Patient Instructions on file for this visit.   Tanya Mcguire   ADDENDUM: [phone] (home)  LM regarding labs.

## 2012-09-26 ENCOUNTER — Telehealth: Payer: Self-pay | Admitting: Family Medicine

## 2012-09-26 ENCOUNTER — Telehealth: Payer: Self-pay | Admitting: Internal Medicine

## 2012-09-26 NOTE — Telephone Encounter (Signed)
[phone] (home)   LM that lab results back. If she calls back: -liver and kidneys looks good -HIV negative -nonspecific findings suggest she is fighting and infection -she has a mild anemia  She should follow up with her doctor after 48 hours of antibiotic or sooner if getting sicker or new symptoms or concerns.

## 2012-09-26 NOTE — Telephone Encounter (Signed)
Called and spoke with pt and pt is aware of lab results and that an appt is needed with Dr. Kirtland Bouchard on 10/27/12.

## 2012-09-26 NOTE — Telephone Encounter (Signed)
Returned pt's call and pt is aware of lab result and an appt was made.

## 2012-09-26 NOTE — Telephone Encounter (Signed)
Pt returning call from Dr Selena Batten. Pls call asap.

## 2012-09-27 ENCOUNTER — Ambulatory Visit (INDEPENDENT_AMBULATORY_CARE_PROVIDER_SITE_OTHER): Payer: BC Managed Care – PPO | Admitting: Internal Medicine

## 2012-09-27 ENCOUNTER — Encounter: Payer: Self-pay | Admitting: Internal Medicine

## 2012-09-27 VITALS — BP 170/100 | HR 88 | Temp 98.6°F | Resp 20 | Wt 191.0 lb

## 2012-09-27 DIAGNOSIS — F909 Attention-deficit hyperactivity disorder, unspecified type: Secondary | ICD-10-CM

## 2012-09-27 DIAGNOSIS — E039 Hypothyroidism, unspecified: Secondary | ICD-10-CM

## 2012-09-27 DIAGNOSIS — R509 Fever, unspecified: Secondary | ICD-10-CM

## 2012-09-27 DIAGNOSIS — I10 Essential (primary) hypertension: Secondary | ICD-10-CM

## 2012-09-27 MED ORDER — LISINOPRIL-HYDROCHLOROTHIAZIDE 20-25 MG PO TABS: 1.0000 | ORAL_TABLET | Freq: Every day | ORAL | Status: DC

## 2012-09-27 NOTE — Progress Notes (Signed)
Subjective:    Patient ID: Tanya Mcguire, female    DOB: 08/23/1966, 46 y.o.   MRN: [id number]  HPI  46 year old patient who has not been seen by me in approximately year and a half. She has a history of hypertension but recently has been off the medication. She was seen 2 days ago for evaluation of fever she has slightly improved on doxycycline but still has intermittent fever. She has been ill with intermittent fever as high as 103 for the past 10 days. She has been using ibuprofen and Tylenol for temperature control.  Past Medical History  Diagnosis Date  . ANXIETY 06/08/2007  . DEPRESSION 06/08/2007  . HYPERTENSION, BENIGN ESSENTIAL 02/19/2008  . HYPOTHYROIDISM 06/08/2007  . ADD (attention deficit disorder with hyperactivity)     History   Social History  . Marital Status: Married    Spouse Name: N/A    Number of Children: N/A  . Years of Education: N/A   Occupational History  . Not on file.   Social History Main Topics  . Smoking status: Never Smoker   . Smokeless tobacco: Not on file  . Alcohol Use: Yes     Comment: occasionally  . Drug Use: Not on file  . Sexually Active: Not on file   Other Topics Concern  . Not on file   Social History Narrative  . No narrative on file    No past surgical history on file.  No family history on file.  No Known Allergies  Current Outpatient Prescriptions on File Prior to Visit  Medication Sig Dispense Refill  . amphetamine-dextroamphetamine (ADDERALL XR) 30 MG 24 hr capsule Take 30 mg by mouth every morning. Patient uses 15 milligrams of this medication when she uses it.      [name] THYROID 30 MG tablet TAKE 5 TABLETS ONCE A DAY.  150 tablet  3  . doxycycline (VIBRA-TABS) 100 MG tablet Take 1 tablet (100 mg total) by mouth 2 (two) times daily.  14 tablet  0  . [DISCONTINUED] lisinopril-hydrochlorothiazide (PRINZIDE,ZESTORETIC) 20-25 MG per tablet Take 1 tablet by mouth daily.  90 tablet  4    BP 170/100  Pulse 88  Temp  98.6 F (37 C) (Oral)  Resp 20  Wt 191 lb (86.637 kg)  SpO2 96%     Wt Readings from Last 3 Encounters:  09/27/12 191 lb (86.637 kg)  09/25/12 191 lb (86.637 kg)  04/23/12 190 lb (86.183 kg)    BP Readings from Last 3 Encounters:  09/27/12 170/100  09/25/12 122/80  04/23/12 165/85    Review of Systems  Constitutional: Positive for fever and fatigue.  HENT: Positive for sore throat. Negative for hearing loss, congestion, rhinorrhea, dental problem, sinus pressure and tinnitus.   Eyes: Negative for pain, discharge and visual disturbance.  Respiratory: Negative for cough and shortness of breath.   Cardiovascular: Negative for chest pain, palpitations and leg swelling.  Gastrointestinal: Negative for nausea, vomiting, abdominal pain, diarrhea, constipation, blood in stool and abdominal distention.  Genitourinary: Negative for dysuria, urgency, frequency, hematuria, flank pain, vaginal bleeding, vaginal discharge, difficulty urinating, vaginal pain and pelvic pain.  Musculoskeletal: Negative for joint swelling, arthralgias and gait problem.  Skin: Negative for rash.  Neurological: Positive for weakness. Negative for dizziness, syncope, speech difficulty, numbness and headaches.  Hematological: Negative for adenopathy.  Psychiatric/Behavioral: Negative for behavioral problems, dysphoric mood and agitation. The patient is not nervous/anxious.        Objective:   Physical Exam  Constitutional: She is oriented to person, place, and time. She appears well-developed and well-nourished.  HENT:  Head: Normocephalic.  Right Ear: External ear normal.  Left Ear: External ear normal.       Oropharynx slightly erythematous  Eyes: Conjunctivae normal and EOM are normal. Pupils are equal, round, and reactive to light.  Neck: Normal range of motion. Neck supple. No thyromegaly present.  Cardiovascular: Normal rate, regular rhythm, normal heart sounds and intact distal pulses.     Pulmonary/Chest: Effort normal and breath sounds normal.  Abdominal: Soft. Bowel sounds are normal. She exhibits no mass. There is no tenderness.  Musculoskeletal: Normal range of motion.  Lymphadenopathy:    She has no cervical adenopathy.  Neurological: She is alert and oriented to person, place, and time.  Skin: Skin is warm and dry. No rash noted.  Psychiatric: She has a normal mood and affect. Her behavior is normal.          Assessment & Plan:   Acute febrile illness. The patient is slightly improved after being on antibiotic therapy for 2 days. White blood cell count was slightly elevated we'll continue antibiotic therapy. We'll continue symptom control with ibuprofen. Will call if he fails to improve or there is any deterioration Hypertension her blood pressure normal 2 days ago but was in the 160/100 range today. We'll resume medication home blood pressure monitoring encouraged

## 2012-09-27 NOTE — Patient Instructions (Addendum)
Take your antibiotic as prescribed until ALL of it is gone, but stop if you develop a rash, swelling, or any side effects of the medication.  Contact our office as soon as possible if  there are side effects of the medication.  Limit your sodium (Salt) intake  Please check your blood pressure on a regular basis.  If it is consistently greater than 150/90, please make an office appointment.  Take [phone]  mg of Tylenol every 6 hours as needed for pain relief or fever.  Avoid taking more than 3000 mg in a 24-hour period (  This may cause liver damage).  Take 400-600 mg of ibuprofen ( Advil, Motrin) with food every 4 to 6 hours as needed for pain relief or control of fever

## 2012-10-23 ENCOUNTER — Telehealth: Payer: Self-pay | Admitting: Internal Medicine

## 2012-10-23 ENCOUNTER — Ambulatory Visit: Payer: BC Managed Care – PPO | Admitting: Internal Medicine

## 2012-10-23 NOTE — Telephone Encounter (Signed)
Patient Information:  Caller Name: Tanya Mcguire  Phone: [phone]  Patient: Tanya, Mcguire  Gender: Female  DOB: 1966/10/28  Age: 46 Years  PCP: Tanya Mcguire [facility])  Pregnant: No  Office Follow Up:  Does the office need to follow up with this patient?: Yes  Instructions For The Office: Patient would like follow up from the provider as to whether or not she needs to come in or take any  further interventions due to previous episode of intermittent fever with no cause.  RN Note:  Taking 600mg  every 5 1/2 hours. States that when temp goes up patient states that she feels horrible.  Patient states that she had an episode in the past of fevers and fever is now back and states feels exactly the same this time as she did last time.  Symptoms  Reason For Call & Symptoms: Fever  Reviewed Health History In EMR: Yes  Reviewed Medications In EMR: Yes  Reviewed Allergies In EMR: Yes  Reviewed Surgeries / Procedures: Yes  Date of Onset of Symptoms: 10/21/2012  Treatments Tried: Motrin  Treatments Tried Worked: Yes  Any Fever: Yes  Fever Taken: Oral  Fever Time Of Reading: 03:30:00  Fever Last Reading: 101.4 OB / GYN:  LMP: Unknown  Guideline(s) Used:  Fever  Disposition Per Guideline:   Home Care  Reason For Disposition Reached:   Fever without localizing symptoms  Advice Given:  Fever Medicines:  Ibuprofen (e.g., Motrin, Advil):  Take 400 mg (two 200 mg pills) by mouth every 6 hours.

## 2012-10-24 NOTE — Telephone Encounter (Signed)
rov later this week if any clinical worsening

## 2012-10-24 NOTE — Telephone Encounter (Signed)
Spoke to pt told her if symptoms worsen to call office to be seen. Pt verbalized understanding.

## 2012-11-20 ENCOUNTER — Ambulatory Visit (INDEPENDENT_AMBULATORY_CARE_PROVIDER_SITE_OTHER): Payer: BC Managed Care – PPO | Admitting: Internal Medicine

## 2012-11-20 ENCOUNTER — Encounter: Payer: Self-pay | Admitting: Internal Medicine

## 2012-11-20 VITALS — BP 150/100 | HR 96 | Temp 98.3°F | Resp 18 | Wt 184.0 lb

## 2012-11-20 DIAGNOSIS — E039 Hypothyroidism, unspecified: Secondary | ICD-10-CM

## 2012-11-20 DIAGNOSIS — I10 Essential (primary) hypertension: Secondary | ICD-10-CM

## 2012-11-20 MED ORDER — LISINOPRIL-HYDROCHLOROTHIAZIDE 20-25 MG PO TABS: 1.0000 | ORAL_TABLET | Freq: Every day | ORAL | Status: DC

## 2012-11-20 MED ORDER — AMPHETAMINE-DEXTROAMPHET ER 30 MG PO CP24: 30.0000 mg | ORAL_CAPSULE | ORAL | Status: DC

## 2012-11-20 MED ORDER — THYROID 30 MG PO TABS: 30.0000 mg | ORAL_TABLET | Freq: Every day | ORAL | Status: DC

## 2012-11-20 MED ORDER — PROMETHAZINE HCL 25 MG PO TABS: 25.0000 mg | ORAL_TABLET | Freq: Three times a day (TID) | ORAL | Status: DC | PRN

## 2012-11-20 NOTE — Progress Notes (Signed)
Subjective:    Patient ID: Tanya Mcguire, female    DOB: [birth year], 47 y.o.   MRN: [id number]  HPI  47 year old patient who presents with a three-day history of fever nausea diarrhea. She has a history of ADHD as well as hypothyroidism and hypertension. She has been off her blood pressure medication.  Past Medical History  Diagnosis Date  . ANXIETY 06/08/2007  . DEPRESSION 06/08/2007  . HYPERTENSION, BENIGN ESSENTIAL 02/19/2008  . HYPOTHYROIDISM 06/08/2007  . ADD (attention deficit disorder with hyperactivity)     History   Social History  . Marital Status: Married    Spouse Name: N/A    Number of Children: N/A  . Years of Education: N/A   Occupational History  . Not on file.   Social History Main Topics  . Smoking status: Never Smoker   . Smokeless tobacco: Not on file  . Alcohol Use: Yes     Comment: occasionally  . Drug Use: Not on file  . Sexually Active: Not on file   Other Topics Concern  . Not on file   Social History Narrative  . No narrative on file    No past surgical history on file.  No family history on file.  No Known Allergies  Current Outpatient Prescriptions on File Prior to Visit  Medication Sig Dispense Refill  . amphetamine-dextroamphetamine (ADDERALL XR) 30 MG 24 hr capsule Take 1 capsule (30 mg total) by mouth every morning. Patient uses 15 milligrams of this medication when she uses it.  30 capsule  0  . ibuprofen (ADVIL,MOTRIN) 800 MG tablet Take 800 mg by mouth every 4 (four) hours as needed.      . thyroid (ARMOUR THYROID) 30 MG tablet Take 1 tablet (30 mg total) by mouth daily.  150 tablet  3  . lisinopril-hydrochlorothiazide (PRINZIDE,ZESTORETIC) 20-25 MG per tablet Take 1 tablet by mouth daily.  90 tablet  4  . promethazine (PHENERGAN) 25 MG tablet Take 1 tablet (25 mg total) by mouth every 8 (eight) hours as needed for nausea.  20 tablet  0    BP 150/100  Pulse 96  Temp 98.3 F (36.8 C) (Oral)  Resp 18  Wt 184 lb (83.462  kg)       Review of Systems  Constitutional: Positive for fever, activity change, appetite change and fatigue.  HENT: Negative for hearing loss, congestion, sore throat, rhinorrhea, dental problem, sinus pressure and tinnitus.   Eyes: Negative for pain, discharge and visual disturbance.  Respiratory: Negative for cough and shortness of breath.   Cardiovascular: Negative for chest pain, palpitations and leg swelling.  Gastrointestinal: Positive for nausea and diarrhea. Negative for vomiting, abdominal pain, constipation, blood in stool and abdominal distention.  Genitourinary: Negative for dysuria, urgency, frequency, hematuria, flank pain, vaginal bleeding, vaginal discharge, difficulty urinating, vaginal pain and pelvic pain.  Musculoskeletal: Negative for joint swelling, arthralgias and gait problem.  Skin: Negative for rash.  Neurological: Negative for dizziness, syncope, speech difficulty, weakness, numbness and headaches.  Hematological: Negative for adenopathy.  Psychiatric/Behavioral: Negative for behavioral problems, dysphoric mood and agitation. The patient is not nervous/anxious.        Objective:   Physical Exam  Constitutional: She is oriented to person, place, and time. She appears well-developed and well-nourished.  HENT:  Head: Normocephalic.  Right Ear: External ear normal.  Left Ear: External ear normal.  Mouth/Throat: Oropharynx is clear and moist.  Eyes: Conjunctivae normal and EOM are normal. Pupils are equal, round, and reactive  to light.  Neck: Normal range of motion. Neck supple. No thyromegaly present.  Cardiovascular: Normal rate, regular rhythm, normal heart sounds and intact distal pulses.   Pulmonary/Chest: Effort normal and breath sounds normal.  Abdominal: Soft. Bowel sounds are normal. She exhibits no mass. There is no tenderness.  Musculoskeletal: Normal range of motion.  Lymphadenopathy:    She has no cervical adenopathy.  Neurological: She is  alert and oriented to person, place, and time.  Skin: Skin is warm and dry. No rash noted.  Psychiatric: She has a normal mood and affect. Her behavior is normal.          Assessment & Plan:   Viral gastroenteritis. Will treat symptomatically Hypertension. Compliance with her medication stressed. Medications refilled ADHD. Medications refilled;  three-month supply dispensed Hypothyroidism. Medications refilled. Will recheck in 4 months and check a TSH at that time

## 2012-11-20 NOTE — Patient Instructions (Signed)
Viral Gastroenteritis Viral gastroenteritis is also known as stomach flu. This condition affects the stomach and intestinal tract. It can cause sudden diarrhea and vomiting. The illness typically lasts 3 to 8 days. Most people develop an immune response that eventually gets rid of the virus. While this natural response develops, the virus can make you quite ill. CAUSES   Many different viruses can cause gastroenteritis, such as rotavirus or noroviruses. You can catch one of these viruses by consuming contaminated food or water. You may also catch a virus by sharing utensils or other personal items with an infected person or by touching a contaminated surface. SYMPTOMS   The most common symptoms are diarrhea and vomiting. These problems can cause a severe loss of body fluids (dehydration) and a body salt (electrolyte) imbalance. Other symptoms may include:  Fever.   Headache.   Fatigue.   Abdominal pain.  DIAGNOSIS   Your caregiver can usually diagnose viral gastroenteritis based on your symptoms and a physical exam. A stool sample may also be taken to test for the presence of viruses or other infections. TREATMENT   This illness typically goes away on its own. Treatments are aimed at rehydration. The most serious cases of viral gastroenteritis involve vomiting so severely that you are not able to keep fluids down. In these cases, fluids must be given through an intravenous line (IV). HOME CARE INSTRUCTIONS    Drink enough fluids to keep your urine clear or pale yellow. Drink small amounts of fluids frequently and increase the amounts as tolerated.   Ask your caregiver for specific rehydration instructions.   Avoid:   Foods high in sugar.   Alcohol.   Carbonated drinks.   Tobacco.   Juice.   Caffeine drinks.   Extremely hot or cold fluids.   Fatty, greasy foods.   Too much intake of anything at one time.   Dairy products until 24 to 48 hours after diarrhea stops.   You  may consume probiotics. Probiotics are active cultures of beneficial bacteria. They may lessen the amount and number of diarrheal stools in adults. Probiotics can be found in yogurt with active cultures and in supplements.   Wash your hands well to avoid spreading the virus.   Only take over-the-counter or prescription medicines for pain, discomfort, or fever as directed by your caregiver. Do not give aspirin to children. Antidiarrheal medicines are not recommended.   Ask your caregiver if you should continue to take your regular prescribed and over-the-counter medicines.   Keep all follow-up appointments as directed by your caregiver.  SEEK IMMEDIATE MEDICAL CARE IF:    You are unable to keep fluids down.   You do not urinate at least once every 6 to 8 hours.   You develop shortness of breath.   You notice blood in your stool or vomit. This may look like coffee grounds.   You have abdominal pain that increases or is concentrated in one small area (localized).   You have persistent vomiting or diarrhea.   You have a fever.   The patient is a child younger than 3 months, and he or she has a fever.   The patient is a child older than 3 months, and he or she has a fever and persistent symptoms.   The patient is a child older than 3 months, and he or she has a fever and symptoms suddenly get worse.   The patient is a baby, and he or she has no tears when  crying.  MAKE SURE YOU:    Understand these instructions.   Will watch your condition.   Will get help right away if you are not doing well or get worse.  Document Released: 10/18/2005 Document Revised: 01/10/2012 Document Reviewed: 08/04/2011 [employer] Patient Information 2013 [place], [place].     Please check your blood pressure on a regular basis.  If it is consistently greater than 150/90, please make an office appointment.

## 2013-02-15 ENCOUNTER — Ambulatory Visit (INDEPENDENT_AMBULATORY_CARE_PROVIDER_SITE_OTHER): Payer: BC Managed Care – PPO | Admitting: Family Medicine

## 2013-02-15 ENCOUNTER — Encounter: Payer: Self-pay | Admitting: Family Medicine

## 2013-02-15 VITALS — BP 120/76 | HR 74 | Temp 98.1°F | Wt 191.0 lb

## 2013-02-15 DIAGNOSIS — H6690 Otitis media, unspecified, unspecified ear: Secondary | ICD-10-CM

## 2013-02-15 DIAGNOSIS — H6691 Otitis media, unspecified, right ear: Secondary | ICD-10-CM

## 2013-02-15 DIAGNOSIS — J069 Acute upper respiratory infection, unspecified: Secondary | ICD-10-CM

## 2013-02-15 MED ORDER — HYDROCODONE-HOMATROPINE 5-1.5 MG/5ML PO SYRP: 5.0000 mL | ORAL_SOLUTION | Freq: Three times a day (TID) | ORAL | Status: DC | PRN

## 2013-02-15 MED ORDER — AMOXICILLIN 875 MG PO TABS: 875.0000 mg | ORAL_TABLET | Freq: Two times a day (BID) | ORAL | Status: DC

## 2013-02-15 NOTE — Progress Notes (Signed)
Chief Complaint  Patient presents with  . Cough    congetion, sore throat, low grade fever    HPI:  Acute visit for cough and congestion: -started 4 days ago -symptoms: sore throat, cough, nasal congestion, temp 99, ear fullness and pain, low grade fever and chills -denies: fevers, SOB, NVD -sig other sick -has tried ibuprofen, OTC sinus medicine -no allergy symptoms  ROS: See pertinent positives and negatives per HPI.  Past Medical History  Diagnosis Date  . ANXIETY 06/08/2007  . DEPRESSION 06/08/2007  . HYPERTENSION, BENIGN ESSENTIAL 02/19/2008  . HYPOTHYROIDISM 06/08/2007  . ADD (attention deficit disorder with hyperactivity)     No family history on file.  History   Social History  . Marital Status: Married    Spouse Name: N/A    Number of Children: N/A  . Years of Education: N/A   Social History Main Topics  . Smoking status: Never Smoker   . Smokeless tobacco: None  . Alcohol Use: Yes     Comment: occasionally  . Drug Use: None  . Sexually Active: None   Other Topics Concern  . None   Social History Narrative  . None    Current outpatient prescriptions:amphetamine-dextroamphetamine (ADDERALL XR) 30 MG 24 hr capsule, Take 1 capsule (30 mg total) by mouth every morning. Patient uses 15 milligrams of this medication when she uses it., Disp: 30 capsule, Rfl: 0;  ibuprofen (ADVIL,MOTRIN) 800 MG tablet, Take 800 mg by mouth every 4 (four) hours as needed., Disp: , Rfl:  lisinopril-hydrochlorothiazide (PRINZIDE,ZESTORETIC) 20-25 MG per tablet, Take 1 tablet by mouth daily., Disp: 90 tablet, Rfl: 4;  promethazine (PHENERGAN) 25 MG tablet, Take 1 tablet (25 mg total) by mouth every 8 (eight) hours as needed for nausea., Disp: 20 tablet, Rfl: 0;  thyroid (ARMOUR THYROID) 30 MG tablet, Take 1 tablet (30 mg total) by mouth daily., Disp: 150 tablet, Rfl: 3 amoxicillin (AMOXIL) 875 MG tablet, Take 1 tablet (875 mg total) by mouth 2 (two) times daily., Disp: 20 tablet, Rfl: 0;   HYDROcodone-homatropine (HYCODAN) 5-1.5 MG/5ML syrup, Take 5 mLs by mouth every 8 (eight) hours as needed for cough., Disp: 120 mL, Rfl: 0  EXAM:  Filed Vitals:   02/15/13 1100  BP: 120/76  Pulse: 74  Temp: 98.1 F (36.7 C)    Body mass index is 30.84 kg/(m^2).  GENERAL: vitals reviewed and listed above, alert, oriented, appears well hydrated and in no acute distress  HEENT: atraumatic, conjunttiva clear, no obvious abnormalities on inspection of external nose and ears, normal appearance of ear canals and TMs except R TM red and bulging, clear nasal congestion, mild post oropharyngeal erythema with PND, no tonsillar edema or exudate, no sinus TTP  NECK: no obvious masses on inspection  LUNGS: clear to auscultation bilaterally, no wheezes, rales or rhonchi, good air movement  CV: HRRR, no peripheral edema  MS: moves all extremities without noticeable abnormality  PSYCH: pleasant and cooperative, no obvious depression or anxiety  ASSESSMENT AND PLAN:  Discussed the following assessment and plan:  Otitis media, right - Plan: amoxicillin (AMOXIL) 875 MG tablet  Upper respiratory infection - Plan: HYDROcodone-homatropine (HYCODAN) 5-1.5 MG/5ML syrup  -URI with findings on exam c/w R OM. Amox - risks discussed. -Patient advised to return or notify a doctor immediately if symptoms worsen or persist or new concerns arise.  There are no Patient Instructions on file for this visit.   Kriste Basque R.

## 2013-03-22 ENCOUNTER — Telehealth: Payer: Self-pay | Admitting: Internal Medicine

## 2013-03-22 MED ORDER — THYROID 30 MG PO TABS: ORAL_TABLET | ORAL | Status: DC

## 2013-03-22 NOTE — Telephone Encounter (Signed)
Please call in a new prescription to take 4 daily

## 2013-03-22 NOTE — Telephone Encounter (Signed)
Left detailed message new Rx for Armour Thyroid 30 mg 4 tablets daily was sent to pharmacy.

## 2013-03-22 NOTE — Telephone Encounter (Signed)
PT called to request a new RX for her thyroid (ARMOUR THYROID) 30 MG tablet. She stated that she has always taken 120mg -150mg  a day, but the instructions on this RX only say 30mg  a day. Therefor, she has went through this RX too fast and the pharmacy will not refill. Please assist.

## 2013-03-22 NOTE — Telephone Encounter (Signed)
Dr. Amador Cunas please clarify dosage of Armour Thyroid?

## 2013-03-22 NOTE — Addendum Note (Signed)
Addended by: Jimmye Norman on: 03/22/2013 01:01 PM   Modules accepted: Orders

## 2013-08-06 ENCOUNTER — Telehealth: Payer: Self-pay | Admitting: Internal Medicine

## 2013-08-06 MED ORDER — AMPHETAMINE-DEXTROAMPHET ER 30 MG PO CP24: 30.0000 mg | ORAL_CAPSULE | ORAL | Status: DC

## 2013-08-06 NOTE — Telephone Encounter (Signed)
Spoke to pt told her Rx's ready for pickup. Pt verbalized understanding. Rx's printed and signed put at front desk for pickup.

## 2013-08-06 NOTE — Telephone Encounter (Signed)
Pt needs new rx generic adderall 30 mg xr #30. Pt is out

## 2013-08-14 ENCOUNTER — Encounter: Payer: Self-pay | Admitting: Podiatry

## 2013-08-14 ENCOUNTER — Ambulatory Visit (INDEPENDENT_AMBULATORY_CARE_PROVIDER_SITE_OTHER): Payer: BC Managed Care – PPO | Admitting: Podiatry

## 2013-08-14 VITALS — BP 176/107 | HR 76 | Resp 17

## 2013-08-14 DIAGNOSIS — M722 Plantar fascial fibromatosis: Secondary | ICD-10-CM | POA: Insufficient documentation

## 2013-08-14 DIAGNOSIS — M79609 Pain in unspecified limb: Secondary | ICD-10-CM

## 2013-08-14 DIAGNOSIS — M79673 Pain in unspecified foot: Secondary | ICD-10-CM | POA: Insufficient documentation

## 2013-08-14 MED ORDER — METHYLPREDNISOLONE (PAK) 4 MG PO TABS: ORAL_TABLET | ORAL | Status: DC

## 2013-08-14 NOTE — Patient Instructions (Signed)
Plantar Fasciitis (Heel Spur Syndrome) with Rehab The plantar fascia is a fibrous, ligament-like, soft-tissue structure that spans the bottom of the foot. Plantar fasciitis is a condition that causes pain in the foot due to inflammation of the tissue. SYMPTOMS   Pain and tenderness on the underneath side of the foot.  Pain that worsens with standing or walking. CAUSES  Plantar fasciitis is caused by irritation and injury to the plantar fascia on the underneath side of the foot. Common mechanisms of injury include:  Direct trauma to bottom of the foot.  Damage to a small nerve that runs under the foot where the main fascia attaches to the heel bone.  Stress placed on the plantar fascia due to bone spurs. RISK INCREASES WITH:   Activities that place stress on the plantar fascia (running, jumping, pivoting, or cutting).  Poor strength and flexibility.  Improperly fitted shoes.  Tight calf muscles.  Flat feet.  Failure to warm-up properly before activity.  Obesity. PREVENTION  Warm up and stretch properly before activity.  Allow for adequate recovery between workouts.  Maintain physical fitness:  Strength, flexibility, and endurance.  Cardiovascular fitness.  Maintain a health body weight.  Avoid stress on the plantar fascia.  Wear properly fitted shoes, including arch supports for individuals who have flat feet. PROGNOSIS  If treated properly, then the symptoms of plantar fasciitis usually resolve without surgery. However, occasionally surgery is necessary. RELATED COMPLICATIONS   Recurrent symptoms that may result in a chronic condition.  Problems of the lower back that are caused by compensating for the injury, such as limping.  Pain or weakness of the foot during push-off following surgery.  Chronic inflammation, scarring, and partial or complete fascia tear, occurring more often from repeated injections. TREATMENT  Treatment initially involves the use of  ice and medication to help reduce pain and inflammation. The use of strengthening and stretching exercises may help reduce pain with activity, especially stretches of the Achilles tendon. These exercises may be performed at home or with a therapist. Your caregiver may recommend that you use heel cups of arch supports to help reduce stress on the plantar fascia. Occasionally, corticosteroid injections are given to reduce inflammation. If symptoms persist for greater than 6 months despite non-surgical (conservative), then surgery may be recommended.  MEDICATION   If pain medication is necessary, then nonsteroidal anti-inflammatory medications, such as aspirin and ibuprofen, or other minor pain relievers, such as acetaminophen, are often recommended.  Do not take pain medication within 7 days before surgery.  Prescription pain relievers may be given if deemed necessary by your caregiver. Use only as directed and only as much as you need.  Corticosteroid injections may be given by your caregiver. These injections should be reserved for the most serious cases, because they may only be given a certain number of times. HEAT AND COLD  Cold treatment (icing) relieves pain and reduces inflammation. Cold treatment should be applied for 10 to 15 minutes every 2 to 3 hours for inflammation and pain and immediately after any activity that aggravates your symptoms. Use ice packs or massage the area with a piece of ice (ice massage).  Heat treatment may be used prior to performing the stretching and strengthening activities prescribed by your caregiver, physical therapist, or athletic trainer. Use a heat pack or soak the injury in warm water. SEEK IMMEDIATE MEDICAL CARE IF:  Treatment seems to offer no benefit, or the condition worsens.  Any medications produce adverse side effects. EXERCISES RANGE   OF MOTION (ROM) AND STRETCHING EXERCISES - Plantar Fasciitis (Heel Spur Syndrome) These exercises may help you  when beginning to rehabilitate your injury. Your symptoms may resolve with or without further involvement from your physician, physical therapist or athletic trainer. While completing these exercises, remember:   Restoring tissue flexibility helps normal motion to return to the joints. This allows healthier, less painful movement and activity.  An effective stretch should be held for at least 30 seconds.  A stretch should never be painful. You should only feel a gentle lengthening or release in the stretched tissue. RANGE OF MOTION - Toe Extension, Flexion  Sit with your right / left leg crossed over your opposite knee.  Grasp your toes and gently pull them back toward the top of your foot. You should feel a stretch on the bottom of your toes and/or foot.  Hold this stretch for __________ seconds.  Now, gently pull your toes toward the bottom of your foot. You should feel a stretch on the top of your toes and or foot.  Hold this stretch for __________ seconds. Repeat __________ times. Complete this stretch __________ times per day.  RANGE OF MOTION - Ankle Dorsiflexion, Active Assisted  Remove shoes and sit on a chair that is preferably not on a carpeted surface.  Place right / left foot under knee. Extend your opposite leg for support.  Keeping your heel down, slide your right / left foot back toward the chair until you feel a stretch at your ankle or calf. If you do not feel a stretch, slide your bottom forward to the edge of the chair, while still keeping your heel down.  Hold this stretch for __________ seconds. Repeat __________ times. Complete this stretch __________ times per day.  STRETCH  Gastroc, Standing  Place hands on wall.  Extend right / left leg, keeping the front knee somewhat bent.  Slightly point your toes inward on your back foot.  Keeping your right / left heel on the floor and your knee straight, shift your weight toward the wall, not allowing your back to  arch.  You should feel a gentle stretch in the right / left calf. Hold this position for __________ seconds. Repeat __________ times. Complete this stretch __________ times per day. STRETCH  Soleus, Standing  Place hands on wall.  Extend right / left leg, keeping the other knee somewhat bent.  Slightly point your toes inward on your back foot.  Keep your right / left heel on the floor, bend your back knee, and slightly shift your weight over the back leg so that you feel a gentle stretch deep in your back calf.  Hold this position for __________ seconds. Repeat __________ times. Complete this stretch __________ times per day. STRETCH  Gastrocsoleus, Standing  Note: This exercise can place a lot of stress on your foot and ankle. Please complete this exercise only if specifically instructed by your caregiver.   Place the ball of your right / left foot on a step, keeping your other foot firmly on the same step.  Hold on to the wall or a rail for balance.  Slowly lift your other foot, allowing your body weight to press your heel down over the edge of the step.  You should feel a stretch in your right / left calf.  Hold this position for __________ seconds.  Repeat this exercise with a slight bend in your right / left knee. Repeat __________ times. Complete this stretch __________ times per day.    STRENGTHENING EXERCISES - Plantar Fasciitis (Heel Spur Syndrome)  These exercises may help you when beginning to rehabilitate your injury. They may resolve your symptoms with or without further involvement from your physician, physical therapist or athletic trainer. While completing these exercises, remember:   Muscles can gain both the endurance and the strength needed for everyday activities through controlled exercises.  Complete these exercises as instructed by your physician, physical therapist or athletic trainer. Progress the resistance and repetitions only as guided. STRENGTH - Towel  Curls  Sit in a chair positioned on a non-carpeted surface.  Place your foot on a towel, keeping your heel on the floor.  Pull the towel toward your heel by only curling your toes. Keep your heel on the floor.  If instructed by your physician, physical therapist or athletic trainer, add ____________________ at the end of the towel. Repeat __________ times. Complete this exercise __________ times per day. STRENGTH - Ankle Inversion  Secure one end of a rubber exercise band/tubing to a fixed object (table, pole). Loop the other end around your foot just before your toes.  Place your fists between your knees. This will focus your strengthening at your ankle.  Slowly, pull your big toe up and in, making sure the band/tubing is positioned to resist the entire motion.  Hold this position for __________ seconds.  Have your muscles resist the band/tubing as it slowly pulls your foot back to the starting position. Repeat __________ times. Complete this exercises __________ times per day.  Document Released: 10/18/2005 Document Revised: 01/10/2012 Document Reviewed: 01/30/2009 [employer] Patient Information 2014 [employer]. Plantar Fasciitis Plantar fasciitis is a common condition that causes foot pain. It is soreness (inflammation) of the band of tough fibrous tissue on the bottom of the foot that runs from the heel bone (calcaneus) to the ball of the foot. The cause of this soreness may be from excessive standing, poor fitting shoes, running on hard surfaces, being overweight, having an abnormal walk, or overuse (this is common in runners) of the painful foot or feet. It is also common in aerobic exercise dancers and ballet dancers. SYMPTOMS  Most people with plantar fasciitis complain of:  Severe pain in the morning on the bottom of their foot especially when taking the first steps out of bed. This pain recedes after a few minutes of walking.  Severe pain is experienced also during walking  following a long period of inactivity.  Pain is worse when walking barefoot or up stairs DIAGNOSIS   Your caregiver will diagnose this condition by examining and feeling your foot.  Special tests such as X-rays of your foot, are usually not needed. PREVENTION   Consult a sports medicine professional before beginning a new exercise program.  Walking programs offer a good workout. With walking there is a lower chance of overuse injuries common to runners. There is less impact and less jarring of the joints.  Begin all new exercise programs slowly. If problems or pain develop, decrease the amount of time or distance until you are at a comfortable level.  Wear good shoes and replace them regularly.  Stretch your foot and the heel cords at the back of the ankle (Achilles tendon) both before and after exercise.  Run or exercise on even surfaces that are not hard. For example, asphalt is better than pavement.  Do not run barefoot on hard surfaces.  If using a treadmill, vary the incline.  Do not continue to workout if you have foot or joint   problems. Seek professional help if they do not improve. HOME CARE INSTRUCTIONS   Avoid activities that cause you pain until you recover.  Use ice or cold packs on the problem or painful areas after working out.  Only take over-the-counter or prescription medicines for pain, discomfort, or fever as directed by your caregiver.  Soft shoe inserts or athletic shoes with air or gel sole cushions may be helpful.  If problems continue or become more severe, consult a sports medicine caregiver or your own health care provider. Cortisone is a potent anti-inflammatory medication that may be injected into the painful area. You can discuss this treatment with your caregiver. MAKE SURE YOU:   Understand these instructions.  Will watch your condition.  Will get help right away if you are not doing well or get worse. Document Released: 07/13/2001 Document  Revised: 01/10/2012 Document Reviewed: 09/11/2008 [facility] Patient Information 2014 [place], [place]. ICE INSTRUCTIONS  Apply ice or cold pack to the affected area at least 3 times a day for 10-15 minutes each time.  You should also use ice after prolonged activity or vigorous exercise.  Do not apply ice longer than 20 minutes at one time.  Always keep a cloth between your skin and the ice pack to prevent burns.  Being consistent and following these instructions will help control your symptoms.  We suggest you purchase a gel ice pack because they are reusable and do bit leak.  Some of them are designed to wrap around the area.  Use the method that works best for you.  Here are some other suggestions for icing.   Use a frozen bag of peas or corn-inexpensive and molds well to your body, usually stays frozen for 10 to 20 minutes. Wet a towel with cold water and squeeze out the excess until it's damp.  Place in a bag in the freezer for 20 minutes. Then remove and use.STRAPPING INSTRUCTIONS  Strapping's need to be worn for 5 days for maximum benefit.  If you next appointment is before 5 days, please remove prior to coming in.  Try to avoid putting lotion on feet during the taping process.  The tape will not stick as well and will not be as effective.  If you were given stretching exercises, start these after removal of the tape.  These exercises will actually loosen the tape and you may not receive full benefit of the strapping.  It is important that you wear sturdy shoes at all times when walking.  Bedroom shoes, slippers, flip flops, etc. are not acceptable.  **If at any time while wearing the strapping you should notice any irritation such as a rash, redness, or itching, remove the tape and wash your foot/feet thoroughly.  BATHING INSTRUCTIONS  Take a washcloth, fold it a few times and tape it around your ankle.   Then take a garbage bag, place it over your foot and angle and tape it above and below  the washcloth.  TAKE A QUICK SHOWER.  The washcloth should absorb any water that runs down into the garbage bag.  If your foot gets wet, take a towel and absorb as much of the water as possible. You can also take a blow dryer, put it on low heat, and dry your bandage.

## 2013-08-14 NOTE — Progress Notes (Signed)
  Subjective:    Patient ID: Tanya Mcguire, female    DOB: Nov 29, 1965, 47 y.o.   MRN: [id number]  HPIN left heel pain       L Left heel and arch pain        D 3 months        O sudden after an episode of walking for exercise       C dull ache with occasion sharp pain        A wt-bearing, after resting and in the morning        T Dr Thurston Hole injected, Mobic, change of shoe gear, and rest from work x 2 weeks.   Review of Systems     Objective:   Physical Exam        Assessment & Plan:

## 2013-08-15 NOTE — Progress Notes (Signed)
Tanya Mcguire presents today as a 47 year old white female on referral from Dr. Hadassah Pais. She is being seen today for left heel pain x3 months states that come on suddenly after an episode of walking and exercise. She states that the pain is dull and achy site occasionally sharp in nature to the left heel. Hurts primarily after she's been nonweightbearing for a while and gets back up to start walking particularly in the mornings. Dr. Louellen Molder gave her an injection to the bottom of the heel and put her on Mobic and she was off work for 2 weeks for rest.  Objective: Vital signs are stable she is alert and oriented x3. I have reviewed her past medical history medications allergies and review of systems. Pulses are strongly palpable bilateral. Capillary fill times to digits one through 5 of the bilateral foot is noted to be immediate. Neurologic sensorium is intact per Semmes-Weinstein monofilament, deep tendon reflexes are intact brisk and symmetrical he equal bilateral. Muscle strength is +5 over 5 intrinsic and extrinsic muscles. Orthopedic evaluation demonstrates all joints distal to the ankle have a full range of motion without crepitus she has no pain on palpation of the right heel and no pain on medial lateral compression of the calcaneus bilaterally. She does have pain on palpation of the medial calcaneal tubercle at the plantar fascial calcaneal insertion site of the left heel. Cutaneous evaluation Mr. is supple while hydrated cutis. Radiographic evaluation does demonstrate a soft tissue increase in density at the plantar fascial calcaneal insertion site.  Assessment: Plantar fasciitis left chronic.  Plan: Injected the left heel today. Started on a Sterapred Dosepak. Plantar fascial strapping. Night splint. We dispensed both oral and written home-going instructions discussing plantar fasciitis and stretching exercises. Followup with her in one month

## 2013-09-13 ENCOUNTER — Ambulatory Visit: Payer: Self-pay | Admitting: Podiatry

## 2013-09-20 ENCOUNTER — Ambulatory Visit: Payer: Self-pay | Admitting: Podiatry

## 2013-10-29 ENCOUNTER — Other Ambulatory Visit: Payer: BC Managed Care – PPO

## 2013-11-05 ENCOUNTER — Encounter: Payer: BC Managed Care – PPO | Admitting: Internal Medicine

## 2013-11-05 DIAGNOSIS — Z0289 Encounter for other administrative examinations: Secondary | ICD-10-CM

## 2014-01-18 ENCOUNTER — Other Ambulatory Visit: Payer: Self-pay

## 2014-02-19 ENCOUNTER — Ambulatory Visit (INDEPENDENT_AMBULATORY_CARE_PROVIDER_SITE_OTHER): Payer: BC Managed Care – PPO | Admitting: Internal Medicine

## 2014-02-19 ENCOUNTER — Encounter: Payer: Self-pay | Admitting: Internal Medicine

## 2014-02-19 VITALS — BP 140/86 | HR 75 | Temp 98.6°F | Resp 20 | Ht 66.0 in | Wt 211.0 lb

## 2014-02-19 DIAGNOSIS — E039 Hypothyroidism, unspecified: Secondary | ICD-10-CM

## 2014-02-19 DIAGNOSIS — F411 Generalized anxiety disorder: Secondary | ICD-10-CM

## 2014-02-19 DIAGNOSIS — E669 Obesity, unspecified: Secondary | ICD-10-CM

## 2014-02-19 DIAGNOSIS — I10 Essential (primary) hypertension: Secondary | ICD-10-CM

## 2014-02-19 DIAGNOSIS — F909 Attention-deficit hyperactivity disorder, unspecified type: Secondary | ICD-10-CM

## 2014-02-19 LAB — TSH: TSH: 0.48 u[IU]/mL (ref 0.35–5.50)

## 2014-02-19 MED ORDER — AMPHETAMINE-DEXTROAMPHET ER 30 MG PO CP24: 30.0000 mg | ORAL_CAPSULE | ORAL | Status: DC

## 2014-02-19 MED ORDER — ALPRAZOLAM 0.25 MG PO TABS: 0.2500 mg | ORAL_TABLET | Freq: Two times a day (BID) | ORAL | Status: DC | PRN

## 2014-02-19 MED ORDER — THYROID 120 MG PO TABS: 120.0000 mg | ORAL_TABLET | Freq: Every day | ORAL | Status: DC

## 2014-02-19 MED ORDER — LISINOPRIL-HYDROCHLOROTHIAZIDE 20-25 MG PO TABS: 1.0000 | ORAL_TABLET | Freq: Every day | ORAL | Status: DC

## 2014-02-19 NOTE — Progress Notes (Signed)
Pre-visit discussion using our clinic review tool. No additional management support is needed unless otherwise documented below in the visit note.  

## 2014-02-19 NOTE — Progress Notes (Signed)
Subjective:    Patient ID: Tanya Mcguire, female    DOB: [birth year], 48 y.o.   MRN: [id number]  HPI 48 year old patient who is seen today in followup.  She has not been seen my me in over one year.  She has a history of hypertension, controlled on combination therapy.  She does have a history of anxiety.  Since 2003.  She has gone through a separation and divorce.  Presently, she has considerable situational stress with her fianc, attempting to obtain custody of his children. She has a history of hypothyroidism, which has been controlled on Armour Thyroid. She has ADHD, but has not been on this medication in some time.  She does request a refill.  She states that it is of great benefit.  When she does take this medication. She is requesting Xanax to have on hand to take when necessary.  Past Medical History  Diagnosis Date  . ANXIETY 06/08/2007  . DEPRESSION 06/08/2007  . HYPERTENSION, BENIGN ESSENTIAL 02/19/2008  . HYPOTHYROIDISM 06/08/2007  . ADD (attention deficit disorder with hyperactivity)   . Heel pain     left    History   Social History  . Marital Status: Married    Spouse Name: N/A    Number of Children: N/A  . Years of Education: N/A   Occupational History  . Not on file.   Social History Main Topics  . Smoking status: Never Smoker   . Smokeless tobacco: Not on file  . Alcohol Use: Yes     Comment: occasionally  . Drug Use: Not on file  . Sexual Activity: Not on file   Other Topics Concern  . Not on file   Social History Narrative  . No narrative on file    History reviewed. No pertinent past surgical history.  Family History  Problem Relation Age of Onset  . Diabetes Mother   . Heart disease Father   . Leukemia Father     No Known Allergies  Current Outpatient Prescriptions on File Prior to Visit  Medication Sig Dispense Refill  . thyroid (ARMOUR THYROID) 30 MG tablet Take 4 tablets by mouth daily.  150 tablet  3  . lisinopril-hydrochlorothiazide  (PRINZIDE,ZESTORETIC) 20-25 MG per tablet Take 1 tablet by mouth daily.  90 tablet  4   No current facility-administered medications on file prior to visit.    BP 140/86  Pulse 75  Temp(Src) 98.6 F (37 C) (Oral)  Resp 20  Ht 5\' 6"  (1.676 m)  Wt 211 lb (95.709 kg)  BMI 34.07 kg/m2  SpO2 98%       Review of Systems  Constitutional: Positive for unexpected weight change.  HENT: Negative for congestion, dental problem, hearing loss, rhinorrhea, sinus pressure, sore throat and tinnitus.   Eyes: Negative for pain, discharge and visual disturbance.  Respiratory: Negative for cough and shortness of breath.   Cardiovascular: Negative for chest pain, palpitations and leg swelling.  Gastrointestinal: Negative for nausea, vomiting, abdominal pain, diarrhea, constipation, blood in stool and abdominal distention.  Genitourinary: Negative for dysuria, urgency, frequency, hematuria, flank pain, vaginal bleeding, vaginal discharge, difficulty urinating, vaginal pain and pelvic pain.  Musculoskeletal: Negative for arthralgias, gait problem and joint swelling.  Skin: Negative for rash.  Neurological: Negative for dizziness, syncope, speech difficulty, weakness, numbness and headaches.  Hematological: Negative for adenopathy.  Psychiatric/Behavioral: Positive for decreased concentration. Negative for behavioral problems, dysphoric mood and agitation. The patient is nervous/anxious.  Objective:   Physical Exam  Constitutional: She is oriented to person, place, and time. She appears well-developed and well-nourished.  Repeat blood pressure improved at 120 over 80 Weight 211  HENT:  Head: Normocephalic.  Right Ear: External ear normal.  Left Ear: External ear normal.  Mouth/Throat: Oropharynx is clear and moist.  Eyes: Conjunctivae and EOM are normal. Pupils are equal, round, and reactive to light.  Neck: Normal range of motion. Neck supple. No thyromegaly present.  Cardiovascular:  Normal rate, regular rhythm, normal heart sounds and intact distal pulses.   Pulmonary/Chest: Effort normal and breath sounds normal.  Abdominal: Soft. Bowel sounds are normal. She exhibits no mass. There is no tenderness.  Musculoskeletal: Normal range of motion.  Minimal soft tissue swelling left lateral ankle  Lymphadenopathy:    She has no cervical adenopathy.  Neurological: She is alert and oriented to person, place, and time.  Skin: Skin is warm and dry. No rash noted.  Psychiatric: She has a normal mood and affect. Her behavior is normal.          Assessment & Plan:   Hypothyroidism.  We'll check a TSH and refill Armour Thyroid Hypertension controlled.  Continue present regimen ADHD.  Adderall refilled Situational anxiety.  Xanax refilled Exogenous obesity.  Exercise, weight loss regimen discussed.  CPX 6 months

## 2014-02-19 NOTE — Patient Instructions (Signed)

## 2014-02-19 NOTE — Progress Notes (Signed)
   Subjective:    Patient ID: Tanya AmassEleanor B Pletz, female    DOB: May 11, 1966, 48 y.o.   MRN: [id number]  HPI    Review of Systems     Objective:   Physical Exam        Assessment & Plan:

## 2014-02-20 ENCOUNTER — Telehealth: Payer: Self-pay | Admitting: Internal Medicine

## 2014-02-20 NOTE — Telephone Encounter (Signed)
Relevant patient education assigned to patient using Emmi. ° °

## 2014-06-24 ENCOUNTER — Encounter (HOSPITAL_BASED_OUTPATIENT_CLINIC_OR_DEPARTMENT_OTHER): Payer: Self-pay | Admitting: Emergency Medicine

## 2014-06-24 ENCOUNTER — Emergency Department (HOSPITAL_BASED_OUTPATIENT_CLINIC_OR_DEPARTMENT_OTHER): Payer: BC Managed Care – PPO

## 2014-06-24 ENCOUNTER — Emergency Department (HOSPITAL_BASED_OUTPATIENT_CLINIC_OR_DEPARTMENT_OTHER)
Admission: EM | Admit: 2014-06-24 | Discharge: 2014-06-24 | Disposition: A | Discharge status: Home / Self Care | Payer: BC Managed Care – PPO | Attending: Emergency Medicine | Admitting: Emergency Medicine

## 2014-06-24 DIAGNOSIS — F329 Major depressive disorder, single episode, unspecified: Secondary | ICD-10-CM | POA: Diagnosis not present

## 2014-06-24 DIAGNOSIS — Z79899 Other long term (current) drug therapy: Secondary | ICD-10-CM | POA: Diagnosis not present

## 2014-06-24 DIAGNOSIS — X500XXA Overexertion from strenuous movement or load, initial encounter: Secondary | ICD-10-CM | POA: Diagnosis not present

## 2014-06-24 DIAGNOSIS — F411 Generalized anxiety disorder: Secondary | ICD-10-CM | POA: Insufficient documentation

## 2014-06-24 DIAGNOSIS — E039 Hypothyroidism, unspecified: Secondary | ICD-10-CM | POA: Insufficient documentation

## 2014-06-24 DIAGNOSIS — F909 Attention-deficit hyperactivity disorder, unspecified type: Secondary | ICD-10-CM | POA: Insufficient documentation

## 2014-06-24 DIAGNOSIS — S46909A Unspecified injury of unspecified muscle, fascia and tendon at shoulder and upper arm level, unspecified arm, initial encounter: Secondary | ICD-10-CM | POA: Insufficient documentation

## 2014-06-24 DIAGNOSIS — IMO0002 Reserved for concepts with insufficient information to code with codable children: Secondary | ICD-10-CM | POA: Insufficient documentation

## 2014-06-24 DIAGNOSIS — S53401A Unspecified sprain of right elbow, initial encounter: Secondary | ICD-10-CM

## 2014-06-24 DIAGNOSIS — Y9383 Activity, rough housing and horseplay: Secondary | ICD-10-CM | POA: Insufficient documentation

## 2014-06-24 DIAGNOSIS — Y929 Unspecified place or not applicable: Secondary | ICD-10-CM | POA: Diagnosis not present

## 2014-06-24 DIAGNOSIS — F3289 Other specified depressive episodes: Secondary | ICD-10-CM | POA: Diagnosis not present

## 2014-06-24 DIAGNOSIS — I10 Essential (primary) hypertension: Secondary | ICD-10-CM | POA: Insufficient documentation

## 2014-06-24 DIAGNOSIS — Z791 Long term (current) use of non-steroidal anti-inflammatories (NSAID): Secondary | ICD-10-CM | POA: Insufficient documentation

## 2014-06-24 DIAGNOSIS — S4980XA Other specified injuries of shoulder and upper arm, unspecified arm, initial encounter: Secondary | ICD-10-CM | POA: Insufficient documentation

## 2014-06-24 MED ORDER — TRAMADOL HCL 50 MG PO TABS: 50.0000 mg | ORAL_TABLET | Freq: Four times a day (QID) | ORAL | Status: DC | PRN

## 2014-06-24 MED ORDER — ACETAMINOPHEN 500 MG PO TABS
1000.0000 mg | ORAL_TABLET | Freq: Once | ORAL | Status: AC
  Administered 2014-06-24: 975 mg via ORAL

## 2014-06-24 MED ORDER — ACETAMINOPHEN 325 MG PO TABS
ORAL_TABLET | ORAL | Status: AC
  Filled 2014-06-24: qty 3

## 2014-06-24 MED ORDER — NAPROXEN 500 MG PO TABS: 500.0000 mg | ORAL_TABLET | Freq: Two times a day (BID) | ORAL | Status: DC

## 2014-06-24 MED ORDER — IBUPROFEN 800 MG PO TABS
ORAL_TABLET | ORAL | Status: AC
  Filled 2014-06-24: qty 1

## 2014-06-24 MED ORDER — IBUPROFEN 800 MG PO TABS
800.0000 mg | ORAL_TABLET | Freq: Once | ORAL | Status: AC
  Administered 2014-06-24: 800 mg via ORAL

## 2014-06-24 NOTE — ED Notes (Signed)
Pt reports awoke from sleep by right elbow pain, states she believes she hyperextended it during horseplay with her children last PM before bedtime.

## 2014-06-24 NOTE — ED Provider Notes (Signed)
CSN: [id number]     Arrival date & time 06/24/14  0528 History   First MD Initiated Contact with Patient 06/24/14 [phone]     Chief Complaint  Patient presents with  . Arm Injury     (Consider location/radiation/quality/duration/timing/severity/associated sxs/prior Treatment) Patient is a 48 y.o. female presenting with arm injury. The history is provided by the patient.  Arm Injury Location:  Elbow Injury: yes   Mechanism of injury comment:  Rough housing with kids twisted right elbow prior to bed Elbow location:  R elbow Pain details:    Quality:  Aching   Radiates to:  Does not radiate   Severity:  Severe   Onset quality:  Sudden   Timing:  Constant   Progression:  Unchanged Chronicity:  New Dislocation: no   Foreign body present:  No foreign bodies Relieved by:  Nothing Worsened by:  Nothing tried Ineffective treatments:  None tried Associated symptoms: no back pain, no fever, no muscle weakness, no neck pain, no numbness, no stiffness, no swelling and no tingling   Risk factors: no concern for non-accidental trauma     Past Medical History  Diagnosis Date  . ANXIETY 06/08/2007  . DEPRESSION 06/08/2007  . HYPERTENSION, BENIGN ESSENTIAL 02/19/2008  . HYPOTHYROIDISM 06/08/2007  . ADD (attention deficit disorder with hyperactivity)   . Heel pain     left   History reviewed. No pertinent past surgical history. Family History  Problem Relation Age of Onset  . Diabetes Mother   . Heart disease Father   . Leukemia Father    History  Substance Use Topics  . Smoking status: Never Smoker   . Smokeless tobacco: Not on file  . Alcohol Use: Yes     Comment: occasionally   OB History   Grav Para Term Preterm Abortions TAB SAB Ect Mult Living                 Review of Systems  Constitutional: Negative for fever.  Musculoskeletal: Negative for back pain, neck pain and stiffness.  All other systems reviewed and are negative.     Allergies  Review of patient's allergies  indicates no known allergies.  Home Medications   Prior to Admission medications   Medication Sig Start Date End Date Taking? Authorizing Provider  ALPRAZolam (XANAX) 0.25 MG tablet Take 1 tablet (0.25 mg total) by mouth 2 (two) times daily as needed for anxiety. 02/19/14   Gordy Savers, MD  amphetamine-dextroamphetamine (ADDERALL XR) 30 MG 24 hr capsule Take 1 capsule (30 mg total) by mouth every morning. Patient uses 15 milligrams of this medication when she uses it. 02/19/14   Gordy Savers, MD  amphetamine-dextroamphetamine (ADDERALL XR) 30 MG 24 hr capsule Take 1 capsule (30 mg total) by mouth every morning. Patient uses 15 milligrams of this medication when she uses it. 02/19/14   Gordy Savers, MD  ibuprofen (ADVIL,MOTRIN) 200 MG tablet Take 600-800 mg by mouth every 6 (six) hours as needed.    Historical Provider, MD  lisinopril-hydrochlorothiazide (PRINZIDE,ZESTORETIC) 20-25 MG per tablet Take 1 tablet by mouth daily. 02/19/14 02/19/15  Gordy Savers, MD  naproxen (NAPROSYN) 500 MG tablet Take 1 tablet (500 mg total) by mouth 2 (two) times daily. 06/24/14   Alizandra Loh K Graycen Degan-Rasch, MD  thyroid (ARMOUR) 120 MG tablet Take 1 tablet (120 mg total) by mouth daily. 02/19/14   Gordy Savers, MD  traMADol (ULTRAM) 50 MG tablet Take 1 tablet (50 mg total) by mouth  every 6 (six) hours as needed. 06/24/14   Shanard Treto K Canesha Tesfaye-Rasch, MD   BP 131/84  Pulse 73  Temp(Src) 99.3 F (37.4 C) (Oral)  Resp 16  SpO2 99% Physical Exam  Constitutional: She is oriented to person, place, and time. She appears well-developed and well-nourished. No distress.  HENT:  Head: Normocephalic and atraumatic.  Mouth/Throat: Oropharynx is clear and moist.  Eyes: Conjunctivae are normal. Pupils are equal, round, and reactive to light.  Neck: Normal range of motion. Neck supple.  Cardiovascular: Normal rate, regular rhythm and intact distal pulses.   Pulmonary/Chest: Effort normal and breath  sounds normal. She has no wheezes. She has no rales.  Abdominal: Soft. Bowel sounds are normal. There is no tenderness. There is no rebound and no guarding.  Musculoskeletal: Normal range of motion. She exhibits no edema and no tenderness.       Right elbow: She exhibits normal range of motion, no swelling, no effusion, no deformity and no laceration. No tenderness found. No radial head, no medial epicondyle, no lateral epicondyle and no olecranon process tenderness noted.  Intact biceps tendon intact pronation and supination intact flexion and extension negative neers test of the r shoulder right hand NVI no snuff box tenderness  Neurological: She is alert and oriented to person, place, and time. She has normal reflexes.  Skin: Skin is warm and dry.  Psychiatric: She has a normal mood and affect.    ED Course  Procedures (including critical care time) Labs Review Labs Reviewed - No data to display  Imaging Review Dg Elbow Complete Right  06/24/2014   CLINICAL DATA:  Right elbow pain after hyperextension injury.  EXAM: RIGHT ELBOW - COMPLETE 3+ VIEW  COMPARISON:  None.  FINDINGS: Prominent spurring along the coronoid process. No evidence of acute fracture or dislocation in the right elbow. No focal bone lesion or bone destruction. No significant effusion. Soft tissues are unremarkable.  IMPRESSION: Degenerative changes.  No acute bony abnormalities.   Electronically Signed   By: Burman Nieves M.D.   On: 06/24/2014 06:04     EKG Interpretation None      MDM   Final diagnoses:  Elbow sprain, right, initial encounter    nsaids pain medication ice 20 minutes on q 2 hours and elevation.      Jasmine Awe, MD 06/24/14 [phone]

## 2014-06-24 NOTE — Discharge Instructions (Signed)
Elbow Exercises °EXERCISES °RANGE OF MOTION (ROM) AND STRETCHING EXERCISES  °These exercises may help you when beginning to rehabilitate your injury. Your symptoms may go away with or without further involvement from your physician, physical therapist or athletic trainer. While completing these exercises, remember:  °· Restoring tissue flexibility helps normal motion to return to the joints. This allows healthier, less painful movement and activity. °· An effective stretch should be held for at least 30 seconds. °· A stretch should never be painful. You should only feel a gentle lengthening or release in the stretched tissue. °RANGE OF MOTION - Extension °· Hold your right / left arm at your side and straighten your elbow as far as you can, using your right / left arm muscles. °· Straighten the elbow farther by gently pushing down on your forearm until you feel a gentle stretch on the inside of your elbow. Hold this position for __________ seconds. °· Slowly return to the starting position. °Repeat __________ times. Complete this exercise __________ times per day.  °RANGE OF MOTION - Flexion °· Hold your right / left arm at your side and bend your elbow as far as you can using your arm muscles. °· Bend the right / left elbow farther by gently pushing up on your forearm until you feel a gentle stretch on the outside of your elbow. Hold this position for __________ seconds. °· Slowly return to the starting position. °Repeat __________ times. Complete this exercise __________ times per day.  °RANGE OF MOTION - Supination, Active °· Stand or sit with your elbows at your side. Bend your right / left elbow to 90 degrees. °· Turn your palm upward until you feel a gentle stretch on the inside of your forearm. °· Hold this position for __________ seconds. Slowly release and return to the starting position. °Repeat __________ times. Complete this stretch __________ times per day.  °RANGE OF MOTION - Pronation, Active °· Stand  or sit with your elbows at your side. Bend your right / left elbow to 90 degrees. °· Turn your palm downward until you feel a gentle stretch on the top of your forearm. °· Hold this position for __________ seconds. Slowly release and return to the starting position. °Repeat __________ times. Complete this stretch __________ times per day.  °STRETCH - Elbow Flexors °· Lie on a firm bed or countertop, on your back. Be sure that you are in a comfortable position which will allow you to relax your arm muscles. °· Place a folded towel under your right / left upper arm, so that your elbow and shoulder are at the same height. Extend your arm; your elbow should not rest on the bed or towel °· Allow the weight of your hand to straighten your elbow. Keep your arm and chest muscles relaxed. Your caregiver may ask you to increase the intensity of your stretch by adding a small wrist or hand weight. °· Hold for __________ seconds. You should feel a stretch on the inside of your elbow. Slowly return to the starting position. °Repeat __________ times. Complete this exercise __________ times per day. °STRENGTHENING EXERCISES  °These exercises may help you when beginning to rehabilitate your injury. They may resolve your symptoms with or without further involvement from your physician, physical therapist or athletic trainer. While completing these exercises, remember:  °· Muscles can gain both the endurance and the strength needed for everyday activities through controlled exercises. °· Complete these exercises as instructed by your physician, physical therapist or athletic   trainer. Increase the resistance and repetitions only as guided. °· You may experience muscle soreness or fatigue, but the pain or discomfort you are trying to eliminate should never worsen during these exercises. If this pain does get worse, stop and make sure you are following the directions exactly. If the pain is still present after adjustments, discontinue  the exercise until you can discuss the trouble with your caregiver. °STRENGTH - Elbow Flexors, Isometric  °· Stand or sit upright on a firm surface. Place your right / left arm so that your hand is palm-up and at the height of your waist. °· Place your opposite hand on top of your forearm. Gently push down as your right / left arm resists. Push as hard as you can with both arms without causing any pain or movement at your right / left elbow. Hold this stationary position for __________ seconds. °· Gradually release the tension in both arms. Allow your muscles to relax completely before repeating. °Repeat __________ times. Complete this exercise __________ times per day. °STRENGTH - Elbow Extensors, Isometric °· Stand or sit upright on a firm surface. Place your right / left arm so that your palm faces your abdomen and it is at the height of your waist. °· Place your opposite hand on the underside of your forearm. Gently push up as your right / left arm resists. Push as hard as you can with both arms without causing any pain or movement at your right / left elbow. Hold this stationary position for __________ seconds. °· Gradually release the tension in both arms. Allow your muscles to relax completely before repeating. °Repeat __________ times. Complete this exercise __________ times per day. °STRENGTH - Elbow Flexors, Supinated °· With good posture, stand, or sit on a firm chair without armrests. Allow your right / left arm to rest at your side with your palm facing forward. °· Holding a __________ weight, or gripping a rubber exercise band or tubing, bring your hand toward your shoulder. °· Allow your muscles to control the resistance, as your hand returns to your side. °Repeat __________ times. Complete this exercise __________ times per day.  °STRENGTH - Elbow Extensors, Dynamic °· With good posture, stand, or sit on a firm chair without armrests. Keeping your upper arms at your side, bring both hands up toward  your right / left shoulder while gripping a rubber exercise band or tubing. Your right / left hand should be just below the other hand. °· Straighten your right / left elbow. Hold for __________ seconds. °· Allow your muscles to control the rubber exercise band, as your hand returns to your shoulder. °Repeat __________ times. Complete this exercise __________ times per day. °STRENGTH - Forearm Supinators  °· Sit with your right / left forearm supported on a table, keeping your elbow below shoulder height. Rest your hand over the edge, palm down. °· Gently grip a hammer or a soup ladle. °· Without moving your elbow, slowly turn your palm and hand upward to a "thumbs-up" position. °· Hold this position for __________ seconds. Slowly return to the starting position. °Repeat __________ times. Complete this exercise __________ times per day.  °STRENGTH - Forearm Pronators °· Sit with your right / left forearm supported on a table, keeping your elbow below shoulder height. Rest your hand over the edge, palm up. °· Gently grip a hammer or a soup ladle. °· Without moving your elbow, slowly turn your palm and hand upward to a "thumbs-up" position. °· Hold this   position for __________ seconds. Slowly return to the starting position. Repeat __________ times. Complete this exercise __________ times per day. Document Released: 09/01/2005 Document Revised: 01/10/2012 Document Reviewed: 01/30/2009 [facility] Patient Information 2015 [place], [place]. This information is not intended to replace advice given to you by your health care provider. Make sure you discuss any questions you have with your health care provider.  Cryotherapy Cryotherapy means treatment with cold. Ice or gel packs can be used to reduce both pain and swelling. Ice is the most helpful within the first 24 to 48 hours after an injury or flare-up from overusing a muscle or joint. Sprains, strains, spasms, burning pain, shooting pain, and aches can all be eased  with ice. Ice can also be used when recovering from surgery. Ice is effective, has very few side effects, and is safe for most people to use. PRECAUTIONS  Ice is not a safe treatment option for people with:  Raynaud phenomenon. This is a condition affecting small blood vessels in the extremities. Exposure to cold may cause your problems to return.  Cold hypersensitivity. There are many forms of cold hypersensitivity, including:  Cold urticaria. Red, itchy hives appear on the skin when the tissues begin to warm after being iced.  Cold erythema. This is a red, itchy rash caused by exposure to cold.  Cold hemoglobinuria. Red blood cells break down when the tissues begin to warm after being iced. The hemoglobin that carry oxygen are passed into the urine because they cannot combine with blood proteins fast enough.  Numbness or altered sensitivity in the area being iced. If you have any of the following conditions, do not use ice until you have discussed cryotherapy with your caregiver:  Heart conditions, such as arrhythmia, angina, or chronic heart disease.  High blood pressure.  Healing wounds or open skin in the area being iced.  Current infections.  Rheumatoid arthritis.  Poor circulation.  Diabetes. Ice slows the blood flow in the region it is applied. This is beneficial when trying to stop inflamed tissues from spreading irritating chemicals to surrounding tissues. However, if you expose your skin to cold temperatures for too long or without the proper protection, you can damage your skin or nerves. Watch for signs of skin damage due to cold. HOME CARE INSTRUCTIONS Follow these tips to use ice and cold packs safely.  Place a dry or damp towel between the ice and skin. A damp towel will cool the skin more quickly, so you may need to shorten the time that the ice is used.  For a more rapid response, add gentle compression to the ice.  Ice for no more than 10 to 20 minutes at a  time. The bonier the area you are icing, the less time it will take to get the benefits of ice.  Check your skin after 5 minutes to make sure there are no signs of a poor response to cold or skin damage.  Rest 20 minutes or more between uses.  Once your skin is numb, you can end your treatment. You can test numbness by very lightly touching your skin. The touch should be so light that you do not see the skin dimple from the pressure of your fingertip. When using ice, most people will feel these normal sensations in this order: cold, burning, aching, and numbness.  Do not use ice on someone who cannot communicate their responses to pain, such as small children or people with dementia. HOW TO MAKE AN ICE PACK  Ice packs are the most common way to use ice therapy. Other methods include ice massage, ice baths, and cryosprays. Muscle creams that cause a cold, tingly feeling do not offer the same benefits that ice offers and should not be used as a substitute unless recommended by your caregiver. To make an ice pack, do one of the following:  Place crushed ice or a bag of frozen vegetables in a sealable plastic bag. Squeeze out the excess air. Place this bag inside another plastic bag. Slide the bag into a pillowcase or place a damp towel between your skin and the bag.  Mix 3 parts water with 1 part rubbing alcohol. Freeze the mixture in a sealable plastic bag. When you remove the mixture from the freezer, it will be slushy. Squeeze out the excess air. Place this bag inside another plastic bag. Slide the bag into a pillowcase or place a damp towel between your skin and the bag. SEEK MEDICAL CARE IF:  You develop white spots on your skin. This may give the skin a blotchy (mottled) appearance.  Your skin turns blue or pale.  Your skin becomes waxy or hard.  Your swelling gets worse. MAKE SURE YOU:   Understand these instructions.  Will watch your condition.  Will get help right away if you are  not doing well or get worse. Document Released: 06/14/2011 Document Revised: 03/04/2014 Document Reviewed: 06/14/2011 [facility] Patient Information 2015 [place], [place]. This information is not intended to replace advice given to you by your health care provider. Make sure you discuss any questions you have with your health care provider.

## 2014-06-24 NOTE — ED Notes (Signed)
I fit and adjusted arm sling and took vitals, patient ready for discharge.

## 2014-06-24 NOTE — ED Notes (Signed)
Pt ambulating independently w/ steady gait on d/c in no acute distress, A&Ox4. D/c instructions reviewed w/ pt - pt denies any further questions or concerns at present. Rx given x2  

## 2014-07-24 ENCOUNTER — Other Ambulatory Visit: Payer: Self-pay | Admitting: Obstetrics & Gynecology

## 2014-07-25 LAB — CYTOLOGY - PAP

## 2014-08-22 ENCOUNTER — Encounter: Payer: Self-pay | Admitting: Podiatry

## 2014-08-22 ENCOUNTER — Ambulatory Visit (INDEPENDENT_AMBULATORY_CARE_PROVIDER_SITE_OTHER): Payer: BC Managed Care – PPO | Admitting: Podiatry

## 2014-08-22 ENCOUNTER — Ambulatory Visit (INDEPENDENT_AMBULATORY_CARE_PROVIDER_SITE_OTHER): Payer: BC Managed Care – PPO

## 2014-08-22 VITALS — BP 118/81 | HR 83 | Resp 16 | Ht 66.0 in | Wt 210.0 lb

## 2014-08-22 DIAGNOSIS — M722 Plantar fascial fibromatosis: Secondary | ICD-10-CM

## 2014-08-22 MED ORDER — MELOXICAM 15 MG PO TABS
15.0000 mg | ORAL_TABLET | Freq: Every day | ORAL | Status: DC
Start: 2014-08-22 — End: 2014-11-11

## 2014-08-22 MED ORDER — METHYLPREDNISOLONE (PAK) 4 MG PO TABS: ORAL_TABLET | ORAL | Status: DC

## 2014-08-22 NOTE — Patient Instructions (Signed)
Plantar Fasciitis (Heel Spur Syndrome) with Rehab The plantar fascia is a fibrous, ligament-like, soft-tissue structure that spans the bottom of the foot. Plantar fasciitis is a condition that causes pain in the foot due to inflammation of the tissue. SYMPTOMS   Pain and tenderness on the underneath side of the foot.  Pain that worsens with standing or walking. CAUSES  Plantar fasciitis is caused by irritation and injury to the plantar fascia on the underneath side of the foot. Common mechanisms of injury include:  Direct trauma to bottom of the foot.  Damage to a small nerve that runs under the foot where the main fascia attaches to the heel bone.  Stress placed on the plantar fascia due to bone spurs. RISK INCREASES WITH:   Activities that place stress on the plantar fascia (running, jumping, pivoting, or cutting).  Poor strength and flexibility.  Improperly fitted shoes.  Tight calf muscles.  Flat feet.  Failure to warm-up properly before activity.  Obesity. PREVENTION  Warm up and stretch properly before activity.  Allow for adequate recovery between workouts.  Maintain physical fitness:  Strength, flexibility, and endurance.  Cardiovascular fitness.  Maintain a health body weight.  Avoid stress on the plantar fascia.  Wear properly fitted shoes, including arch supports for individuals who have flat feet. PROGNOSIS  If treated properly, then the symptoms of plantar fasciitis usually resolve without surgery. However, occasionally surgery is necessary. RELATED COMPLICATIONS   Recurrent symptoms that may result in a chronic condition.  Problems of the lower back that are caused by compensating for the injury, such as limping.  Pain or weakness of the foot during push-off following surgery.  Chronic inflammation, scarring, and partial or complete fascia tear, occurring more often from repeated injections. TREATMENT  Treatment initially involves the use of  ice and medication to help reduce pain and inflammation. The use of strengthening and stretching exercises may help reduce pain with activity, especially stretches of the Achilles tendon. These exercises may be performed at home or with a therapist. Your caregiver may recommend that you use heel cups of arch supports to help reduce stress on the plantar fascia. Occasionally, corticosteroid injections are given to reduce inflammation. If symptoms persist for greater than 6 months despite non-surgical (conservative), then surgery may be recommended.  MEDICATION   If pain medication is necessary, then nonsteroidal anti-inflammatory medications, such as aspirin and ibuprofen, or other minor pain relievers, such as acetaminophen, are often recommended.  Do not take pain medication within 7 days before surgery.  Prescription pain relievers may be given if deemed necessary by your caregiver. Use only as directed and only as much as you need.  Corticosteroid injections may be given by your caregiver. These injections should be reserved for the most serious cases, because they may only be given a certain number of times. HEAT AND COLD  Cold treatment (icing) relieves pain and reduces inflammation. Cold treatment should be applied for 10 to 15 minutes every 2 to 3 hours for inflammation and pain and immediately after any activity that aggravates your symptoms. Use ice packs or massage the area with a piece of ice (ice massage).  Heat treatment may be used prior to performing the stretching and strengthening activities prescribed by your caregiver, physical therapist, or athletic trainer. Use a heat pack or soak the injury in warm water. SEEK IMMEDIATE MEDICAL CARE IF:  Treatment seems to offer no benefit, or the condition worsens.  Any medications produce adverse side effects. EXERCISES RANGE   OF MOTION (ROM) AND STRETCHING EXERCISES - Plantar Fasciitis (Heel Spur Syndrome) These exercises may help you  when beginning to rehabilitate your injury. Your symptoms may resolve with or without further involvement from your physician, physical therapist or athletic trainer. While completing these exercises, remember:   Restoring tissue flexibility helps normal motion to return to the joints. This allows healthier, less painful movement and activity.  An effective stretch should be held for at least 30 seconds.  A stretch should never be painful. You should only feel a gentle lengthening or release in the stretched tissue. RANGE OF MOTION - Toe Extension, Flexion  Sit with your right / left leg crossed over your opposite knee.  Grasp your toes and gently pull them back toward the top of your foot. You should feel a stretch on the bottom of your toes and/or foot.  Hold this stretch for __________ seconds.  Now, gently pull your toes toward the bottom of your foot. You should feel a stretch on the top of your toes and or foot.  Hold this stretch for __________ seconds. Repeat __________ times. Complete this stretch __________ times per day.  RANGE OF MOTION - Ankle Dorsiflexion, Active Assisted  Remove shoes and sit on a chair that is preferably not on a carpeted surface.  Place right / left foot under knee. Extend your opposite leg for support.  Keeping your heel down, slide your right / left foot back toward the chair until you feel a stretch at your ankle or calf. If you do not feel a stretch, slide your bottom forward to the edge of the chair, while still keeping your heel down.  Hold this stretch for __________ seconds. Repeat __________ times. Complete this stretch __________ times per day.  STRETCH - Gastroc, Standing  Place hands on wall.  Extend right / left leg, keeping the front knee somewhat bent.  Slightly point your toes inward on your back foot.  Keeping your right / left heel on the floor and your knee straight, shift your weight toward the wall, not allowing your back to  arch.  You should feel a gentle stretch in the right / left calf. Hold this position for __________ seconds. Repeat __________ times. Complete this stretch __________ times per day. STRETCH - Soleus, Standing  Place hands on wall.  Extend right / left leg, keeping the other knee somewhat bent.  Slightly point your toes inward on your back foot.  Keep your right / left heel on the floor, bend your back knee, and slightly shift your weight over the back leg so that you feel a gentle stretch deep in your back calf.  Hold this position for __________ seconds. Repeat __________ times. Complete this stretch __________ times per day. STRETCH - Gastrocsoleus, Standing  Note: This exercise can place a lot of stress on your foot and ankle. Please complete this exercise only if specifically instructed by your caregiver.   Place the ball of your right / left foot on a step, keeping your other foot firmly on the same step.  Hold on to the wall or a rail for balance.  Slowly lift your other foot, allowing your body weight to press your heel down over the edge of the step.  You should feel a stretch in your right / left calf.  Hold this position for __________ seconds.  Repeat this exercise with a slight bend in your right / left knee. Repeat __________ times. Complete this stretch __________ times per day.    STRENGTHENING EXERCISES - Plantar Fasciitis (Heel Spur Syndrome)  These exercises may help you when beginning to rehabilitate your injury. They may resolve your symptoms with or without further involvement from your physician, physical therapist or athletic trainer. While completing these exercises, remember:   Muscles can gain both the endurance and the strength needed for everyday activities through controlled exercises.  Complete these exercises as instructed by your physician, physical therapist or athletic trainer. Progress the resistance and repetitions only as guided. STRENGTH -  Towel Curls  Sit in a chair positioned on a non-carpeted surface.  Place your foot on a towel, keeping your heel on the floor.  Pull the towel toward your heel by only curling your toes. Keep your heel on the floor.  If instructed by your physician, physical therapist or athletic trainer, add ____________________ at the end of the towel. Repeat __________ times. Complete this exercise __________ times per day. STRENGTH - Ankle Inversion  Secure one end of a rubber exercise band/tubing to a fixed object (table, pole). Loop the other end around your foot just before your toes.  Place your fists between your knees. This will focus your strengthening at your ankle.  Slowly, pull your big toe up and in, making sure the band/tubing is positioned to resist the entire motion.  Hold this position for __________ seconds.  Have your muscles resist the band/tubing as it slowly pulls your foot back to the starting position. Repeat __________ times. Complete this exercises __________ times per day.  Document Released: 10/18/2005 Document Revised: 01/10/2012 Document Reviewed: 01/30/2009 [employer] Patient Information 2015 [employer]. This information is not intended to replace advice given to you by your health care provider. Make sure you discuss any questions you have with your health care provider.  

## 2014-08-23 ENCOUNTER — Ambulatory Visit: Payer: BC Managed Care – PPO

## 2014-08-23 DIAGNOSIS — M722 Plantar fascial fibromatosis: Secondary | ICD-10-CM

## 2014-08-23 MED ORDER — DICLOFENAC SODIUM 75 MG PO TBEC
75.0000 mg | DELAYED_RELEASE_TABLET | Freq: Two times a day (BID) | ORAL | Status: DC
Start: 2014-08-23 — End: 2014-11-11

## 2014-08-23 NOTE — Progress Notes (Signed)
Pt stated that dr Al Corpushyatt was going to do another anti inflammatory besides meloxicam, did not do well with that in past. Called in rx for diclofenac

## 2014-08-23 NOTE — Patient Instructions (Signed)

## 2014-08-23 NOTE — Progress Notes (Signed)
She presents today stating that she would like to have injections to the bilateral heels "stating my feet are absolutely killing me". She denies fever chills nausea vomiting muscle aches or pains. She denies any changes in her past medical history medications allergies are social history.  Objective: Vital signs are stable she is alert and oriented x3. She has severe pain on palpation medial calcaneal tubercles. She has no calf pain bilateral. Pulses are strongly palpable.  Assessment: Plantar fasciitis bilateral.  Plan: Discussed etiology pathology conservative versus surgical therapies. At this point I injected her bilateral heels with Kenalog and local anesthetic. Started her on Medrol Dosepak to be followed by diclofenac. Put her in a plantar fascial brace is in a night splint. She was scanned for some orthotics. I will followup with her in one month with his orthotics come in.

## 2014-08-23 NOTE — Progress Notes (Signed)
PT is here to PUO 

## 2014-10-29 ENCOUNTER — Telehealth: Payer: Self-pay | Admitting: Internal Medicine

## 2014-10-29 NOTE — Telephone Encounter (Signed)
Pt says you gave her a 30 MG Adderall Rx with one refill several months ago. States she only got the original filled but not the second. Says she does not take the med daily and has lost her second rx. Would like a new one written.

## 2014-10-29 NOTE — Telephone Encounter (Signed)
Pls advise.  

## 2014-10-29 NOTE — Telephone Encounter (Signed)
ok 

## 2014-10-30 MED ORDER — AMPHETAMINE-DEXTROAMPHET ER 30 MG PO CP24: 30.0000 mg | ORAL_CAPSULE | ORAL | Status: DC

## 2014-10-30 NOTE — Telephone Encounter (Signed)
Called and spoke with pt and pt is aware.  

## 2014-11-11 ENCOUNTER — Ambulatory Visit (INDEPENDENT_AMBULATORY_CARE_PROVIDER_SITE_OTHER): Payer: BLUE CROSS/BLUE SHIELD | Admitting: Family Medicine

## 2014-11-11 ENCOUNTER — Encounter: Payer: Self-pay | Admitting: Family Medicine

## 2014-11-11 VITALS — BP 126/78 | HR 73 | Temp 98.1°F | Ht 66.0 in | Wt 220.2 lb

## 2014-11-11 DIAGNOSIS — J069 Acute upper respiratory infection, unspecified: Secondary | ICD-10-CM

## 2014-11-11 MED ORDER — BENZONATATE 100 MG PO CAPS: 100.0000 mg | ORAL_CAPSULE | Freq: Two times a day (BID) | ORAL | Status: DC | PRN

## 2014-11-11 NOTE — Progress Notes (Signed)
Pre visit review using our clinic review tool, if applicable. No additional management support is needed unless otherwise documented below in the visit note. 

## 2014-11-11 NOTE — Patient Instructions (Signed)
INSTRUCTIONS FOR UPPER RESPIRATORY INFECTION:  -plenty of rest and fluids  -nasal saline wash 2-3 times daily (use prepackaged nasal saline or bottled/distilled water if making your own)   -can use tylenol or ibuprofen as directed for aches and sorethroat  -in the winter time, using a humidifier at night is helpful (please follow cleaning instructions)  -if you are taking a cough medication - use only as directed, may also try a teaspoon of honey to coat the throat and throat lozenges  -for sore throat, salt water gargles can help  -follow up if you have fevers, facial pain, tooth pain, difficulty breathing or are worsening or not getting better in 5-7 days  

## 2014-11-11 NOTE — Progress Notes (Signed)
HPI:  -started: 1 week ago -symptoms:nasal congestion, sore throat, cough, low grade fever initially now resolved, pnd - improved some, no fevers the last few days, but has a little laryngitis and cough -denies:fever, SOB, NVD, tooth pain -has tried: ibuprofen -sick contacts/travel/risks: denies flu exposure, tick exposure or or Ebola risks  ROS: See pertinent positives and negatives per HPI.  Past Medical History  Diagnosis Date  . ANXIETY 06/08/2007  . DEPRESSION 06/08/2007  . HYPERTENSION, BENIGN ESSENTIAL 02/19/2008  . HYPOTHYROIDISM 06/08/2007  . ADD (attention deficit disorder with hyperactivity)   . Heel pain     left    No past surgical history on file.  Family History  Problem Relation Age of Onset  . Diabetes Mother   . Heart disease Father   . Leukemia Father     History   Social History  . Marital Status: Married    Spouse Name: N/A    Number of Children: N/A  . Years of Education: N/A   Social History Main Topics  . Smoking status: Never Smoker   . Smokeless tobacco: None  . Alcohol Use: Yes     Comment: occasionally  . Drug Use: None  . Sexual Activity: None   Other Topics Concern  . None   Social History Narrative    Current outpatient prescriptions: amphetamine-dextroamphetamine (ADDERALL XR) 30 MG 24 hr capsule, Take 1 capsule (30 mg total) by mouth every morning. Patient uses 15 milligrams of this medication when she uses it., Disp: 30 capsule, Rfl: 0;  lisinopril-hydrochlorothiazide (PRINZIDE,ZESTORETIC) 20-25 MG per tablet, Take 1 tablet by mouth daily., Disp: 90 tablet, Rfl: 4 thyroid (ARMOUR) 120 MG tablet, Take 1 tablet (120 mg total) by mouth daily., Disp: 90 tablet, Rfl: 5;  benzonatate (TESSALON) 100 MG capsule, Take 1 capsule (100 mg total) by mouth 2 (two) times daily as needed for cough., Disp: 20 capsule, Rfl: 0  EXAM:  Filed Vitals:   11/11/14 1426  BP: 126/78  Pulse: 73  Temp: 98.1 F (36.7 C)    Body mass index is 35.56  kg/(m^2).  GENERAL: vitals reviewed and listed above, alert, oriented, appears well hydrated and in no acute distress  HEENT: atraumatic, conjunttiva clear, no obvious abnormalities on inspection of external nose and ears, normal appearance of ear canals and TMs, clear nasal congestion, mild post oropharyngeal erythema with PND, no tonsillar edema or exudate, no sinus TTP  NECK: no obvious masses on inspection  LUNGS: clear to auscultation bilaterally, no wheezes, rales or rhonchi, good air movement  CV: HRRR, no peripheral edema  MS: moves all extremities without noticeable abnormality  PSYCH: pleasant and cooperative, no obvious depression or anxiety  ASSESSMENT AND PLAN:  Discussed the following assessment and plan:  Acute upper respiratory infection - Plan: benzonatate (TESSALON) 100 MG capsule  -given HPI and exam findings today, a serious infection or illness is unlikely. We discussed potential etiologies, with VURI being most likely, and advised supportive care and monitoring. We discussed treatment side effects, likely course, antibiotic misuse, transmission, and signs of developing a serious illness. -of course, we advised to return or notify a doctor immediately if symptoms worsen or persist or new concerns arise.    Patient Instructions  INSTRUCTIONS FOR UPPER RESPIRATORY INFECTION:  -plenty of rest and fluids  -nasal saline wash 2-3 times daily (use prepackaged nasal saline or bottled/distilled water if making your own)   -can use tylenol or ibuprofen as directed for aches and sorethroat  -in the  winter time, using a humidifier at night is helpful (please follow cleaning instructions)  -if you are taking a cough medication - use only as directed, may also try a teaspoon of honey to coat the throat and throat lozenges  -for sore throat, salt water gargles can help  -follow up if you have fevers, facial pain, tooth pain, difficulty breathing or are worsening or not  getting better in 5-7 days      KIM, Dahlia ClientHANNAH R.

## 2014-11-26 ENCOUNTER — Ambulatory Visit: Payer: BLUE CROSS/BLUE SHIELD | Admitting: *Deleted

## 2014-11-26 ENCOUNTER — Telehealth: Payer: Self-pay | Admitting: *Deleted

## 2014-11-26 DIAGNOSIS — M722 Plantar fascial fibromatosis: Secondary | ICD-10-CM

## 2014-11-26 NOTE — Telephone Encounter (Signed)
Jody called in tramadol.

## 2014-11-26 NOTE — Telephone Encounter (Signed)
Pt states she told her primary physician that every time she uses Ibuprofen she can feel her face and throat swell.  Her primary doctor told her not to take anymore, and the Mobic Dr. Al CorpusHyatt had prescribed did not help her feet.  Pt states her feet, knees and back are now hurting as well, and she would like a pain medication to be able to maintain her mobility, even temporarily.  I will inform Dr. Al CorpusHyatt of her request and I encouraged her to see an orthopedic doctor for her back and knees.  Pt agreed.

## 2014-11-26 NOTE — Progress Notes (Signed)
PICKING UP ORTHOTICS, ALSO I HAVE NOTICED THE LAST FEW TIMES I HAVE TAKEN IBUPROFEN THAT MY FACE SWELLS SO MY DR TOLD ME NOT TO TAKE IT ANY MORE

## 2014-11-26 NOTE — Patient Instructions (Signed)

## 2015-02-04 ENCOUNTER — Other Ambulatory Visit: Payer: Self-pay | Admitting: *Deleted

## 2015-02-04 MED ORDER — TRAMADOL HCL 50 MG PO TABS: 50.0000 mg | ORAL_TABLET | Freq: Four times a day (QID) | ORAL | Status: DC | PRN

## 2015-02-04 NOTE — Progress Notes (Signed)
[facility]  Dr. Maren BeachHyatt okayed the refill.

## 2015-04-21 IMAGING — CR DG ELBOW COMPLETE 3+V*R*
4 series · 4 of 4 positions shown · non-contrast
Comparison: None.

CLINICAL DATA: Right elbow pain after hyperextension injury.

EXAM:
RIGHT ELBOW - COMPLETE 3+ VIEW

[x elbow joint ap right]
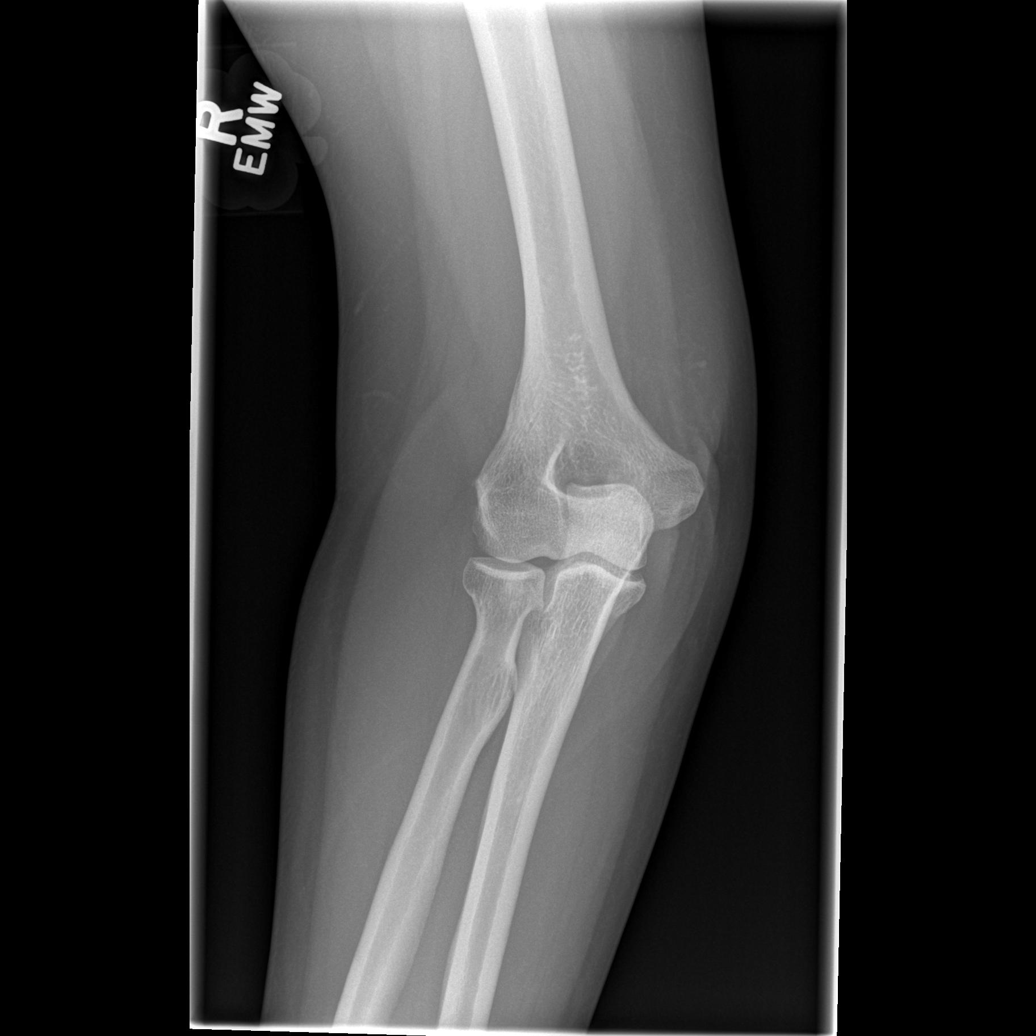

[x elbow joint obl. right (1 of 2)]
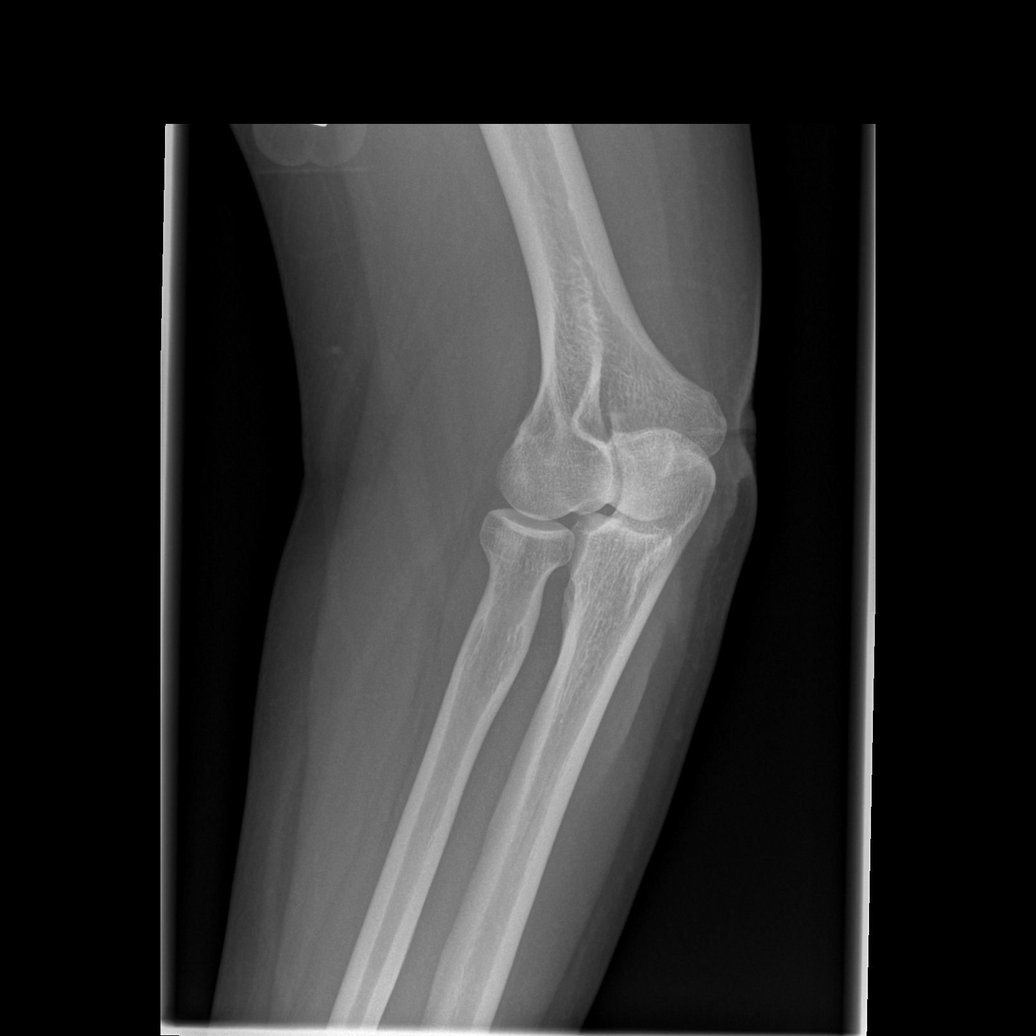

[x elbow joint obl. right (2 of 2)]
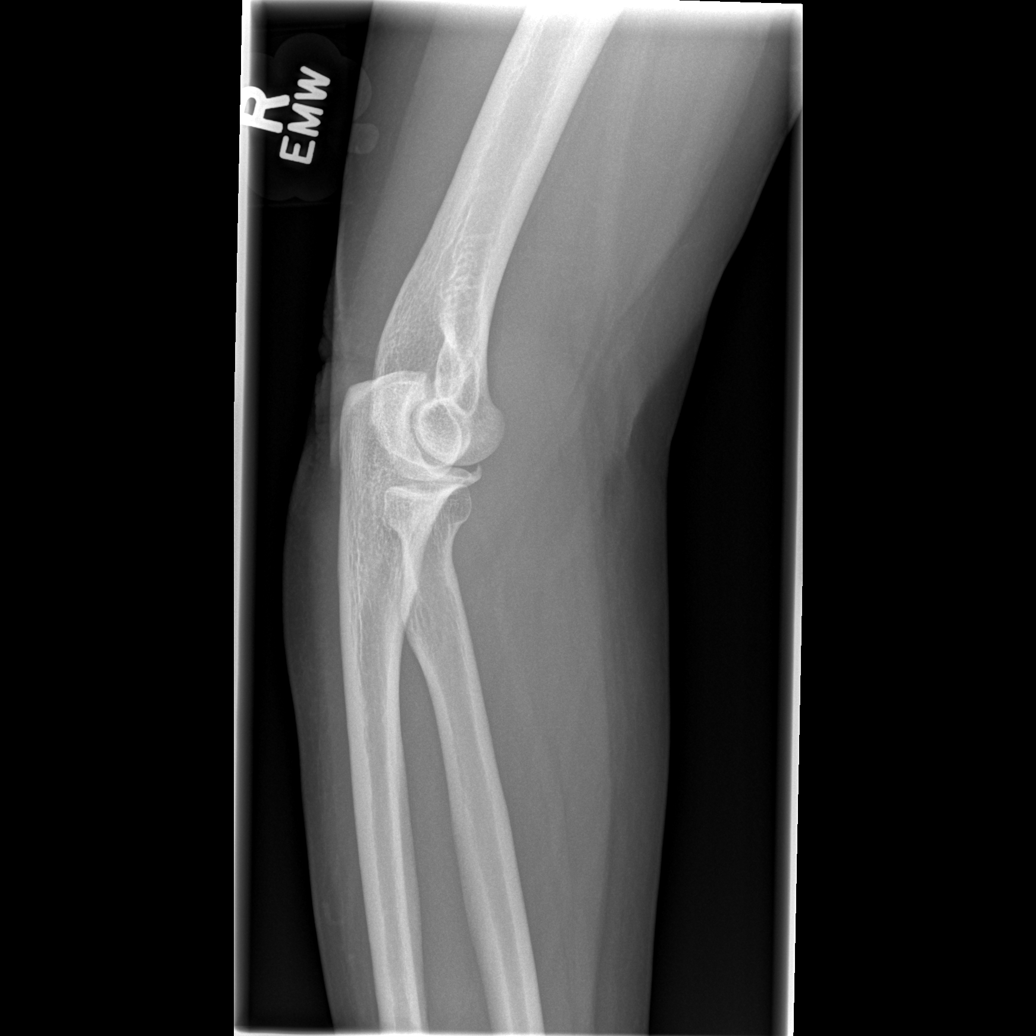

[x elbow joint lat right]
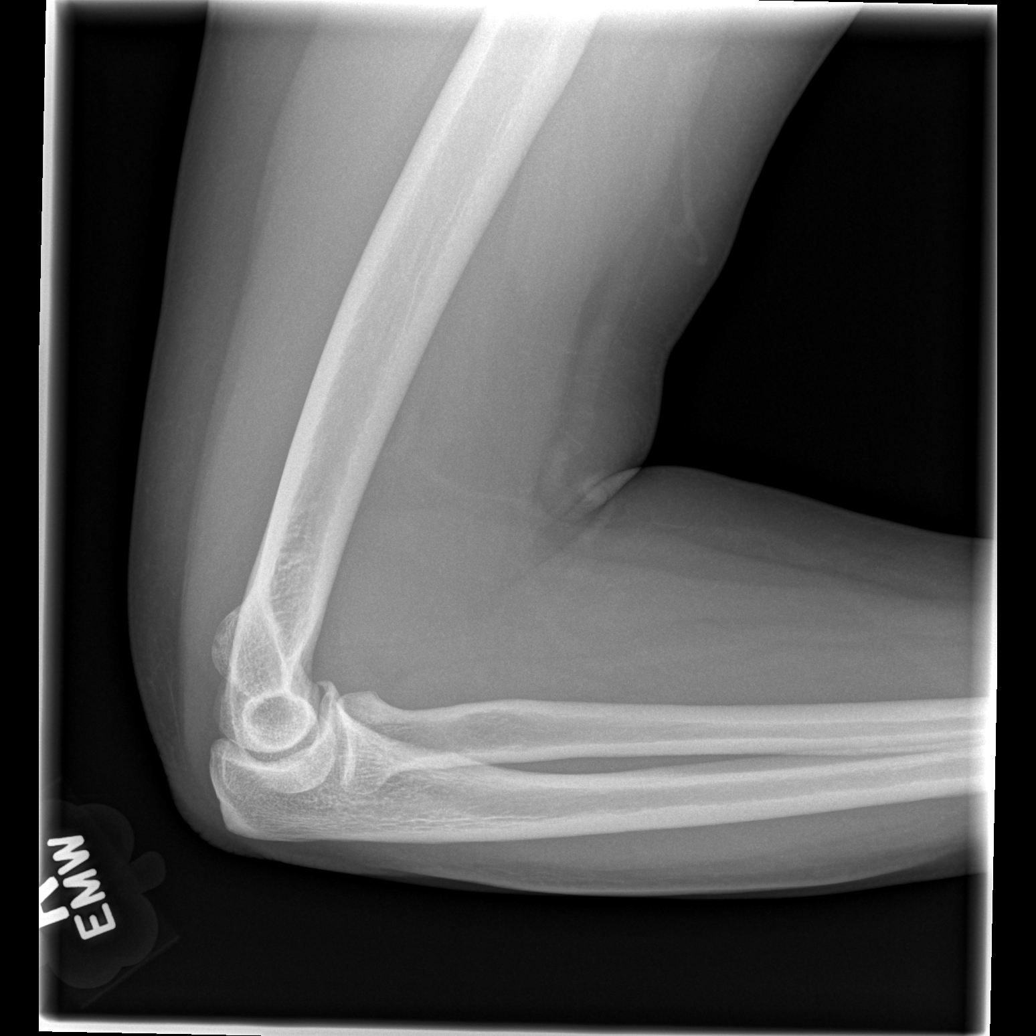

[4 of 4 positions shown; findings below may reference images not displayed]

FINDINGS: Prominent spurring along the coronoid process. No evidence of acute
fracture or dislocation in the right elbow. No focal bone lesion or
bone destruction. No significant effusion. Soft tissues are
unremarkable.
IMPRESSION: Degenerative changes.  No acute bony abnormalities.

## 2015-04-25 ENCOUNTER — Telehealth: Payer: Self-pay | Admitting: *Deleted

## 2015-04-25 NOTE — Telephone Encounter (Signed)
[facility] faxed request for Tramadol denied, pt not seen in office since 11/2014, and was instructed to follow-up in 1 month. Faxed back.

## 2015-05-02 ENCOUNTER — Ambulatory Visit: Payer: BLUE CROSS/BLUE SHIELD | Admitting: Internal Medicine

## 2015-05-02 DIAGNOSIS — Z0289 Encounter for other administrative examinations: Secondary | ICD-10-CM

## 2015-05-22 ENCOUNTER — Other Ambulatory Visit: Payer: Self-pay | Admitting: Internal Medicine

## 2015-05-30 ENCOUNTER — Other Ambulatory Visit: Payer: Self-pay | Admitting: Internal Medicine

## 2015-10-21 ENCOUNTER — Telehealth: Payer: Self-pay | Admitting: *Deleted

## 2015-10-21 NOTE — Telephone Encounter (Signed)
Faxed request for Tramadol.  Pt last appt with Dr. Al CorpusHyatt was 08/2014, pt needs an appt.

## 2015-12-04 ENCOUNTER — Other Ambulatory Visit: Payer: Self-pay | Admitting: Internal Medicine

## 2016-02-24 ENCOUNTER — Ambulatory Visit (INDEPENDENT_AMBULATORY_CARE_PROVIDER_SITE_OTHER): Payer: Self-pay | Admitting: Internal Medicine

## 2016-02-24 ENCOUNTER — Encounter: Payer: Self-pay | Admitting: Internal Medicine

## 2016-02-24 DIAGNOSIS — M25512 Pain in left shoulder: Secondary | ICD-10-CM

## 2016-02-24 DIAGNOSIS — I10 Essential (primary) hypertension: Secondary | ICD-10-CM

## 2016-02-24 MED ORDER — METHYLPREDNISOLONE ACETATE 80 MG/ML IJ SUSP
80.0000 mg | Freq: Once | INTRAMUSCULAR | Status: AC
  Administered 2016-02-24: 80 mg via INTRAMUSCULAR

## 2016-02-24 MED ORDER — TRAMADOL HCL 50 MG PO TABS: 50.0000 mg | ORAL_TABLET | Freq: Four times a day (QID) | ORAL | Status: DC | PRN

## 2016-02-24 NOTE — Progress Notes (Signed)
Subjective:    Patient ID: Tanya Mcguire, female    DOB: [birth year], 50 y.o.   MRN: [id number]  HPI 50 year old patient who presents with a two-month history of a painful left shoulder.  She has some concerns about a possible cardiac etiology She also describes some paresthesias down the left arm Range of motion of the left shoulder tends to aggravate the discomfort She has noticed some decreased range of motion of the neck is best with head turning to the left but this does not seem to aggravate pain or paresthesias  She has a history of essential hypertension which has been controlled.  [place]  Past Medical History  Diagnosis Date  . ANXIETY 06/08/2007  . DEPRESSION 06/08/2007  . HYPERTENSION, BENIGN ESSENTIAL 02/19/2008  . HYPOTHYROIDISM 06/08/2007  . ADD (attention deficit disorder with hyperactivity)   . Heel pain     left     Social History   Social History  . Marital Status: Married    Spouse Name: N/A  . Number of Children: N/A  . Years of Education: N/A   Occupational History  . Not on file.   Social History Main Topics  . Smoking status: Never Smoker   . Smokeless tobacco: Not on file  . Alcohol Use: Yes     Comment: occasionally  . Drug Use: Not on file  . Sexual Activity: Not on file   Other Topics Concern  . Not on file   Social History Narrative    No past surgical history on file.  Family History  Problem Relation Age of Onset  . Diabetes Mother   . Heart disease Father   . Leukemia Father     Allergies  Allergen Reactions  . Ibuprofen Swelling    Current Outpatient Prescriptions on File Prior to Visit  Medication Sig Dispense Refill  . ARMOUR THYROID 120 MG tablet TAKE 1 TABLET BY MOUTH EVERY DAY 90 tablet 0  . lisinopril-hydrochlorothiazide (PRINZIDE,ZESTORETIC) 20-25 MG per tablet Take 1 tablet by mouth daily. 90 tablet 4   No current facility-administered  medications on file prior to visit.    BP 110/70 mmHg  Pulse 61  Temp(Src) 98.5 F (36.9 C) (Oral)  Resp 20  Ht 5\' 6"  (1.676 m)  Wt 205 lb (92.987 kg)  BMI 33.10 kg/m2  SpO2 98%      Review of Systems  Constitutional: Negative.   HENT: Negative for congestion, dental problem, hearing loss, rhinorrhea, sinus pressure, sore throat and tinnitus.   Eyes: Negative for pain, discharge and visual disturbance.  Respiratory: Negative for cough and shortness of breath.   Cardiovascular: Negative for chest pain, palpitations and leg swelling.  Gastrointestinal: Negative for nausea, vomiting, abdominal pain, diarrhea, constipation, blood in stool and abdominal distention.  Genitourinary: Negative for dysuria, urgency, frequency, hematuria, flank pain, vaginal bleeding, vaginal discharge, difficulty urinating, vaginal pain and pelvic pain.  Musculoskeletal: Negative for joint swelling, arthralgias and gait problem.       Left shoulder pain  Skin: Negative for rash.  Neurological: Negative for dizziness, syncope, speech difficulty, weakness, numbness and headaches.       Intermittent paresthesias left arm  Hematological: Negative for adenopathy.  Psychiatric/Behavioral: Negative for behavioral problems, dysphoric mood and agitation. The patient is not nervous/anxious.        Objective:   Physical Exam  Constitutional: She appears well-developed. No distress.  Weight 205 Blood pressure low  normal  Neck:  Range of motion.  Fairly intact but limited slightly with head turning to the left  Musculoskeletal:  Range of motion left shoulder appeared to be intact but uncomfortable with passive range of motion           Assessment & Plan:   Left shoulder pain.  Probable most consistent with a left shoulder tendinopathy, although paresthesias might suggest a cervical radiculopathy.  Will treat with Depo-Medrol 80 and observe Tramadol for pain Essential hypertension, well-controlled

## 2016-02-24 NOTE — Addendum Note (Signed)
Addended by: Jimmye NormanPHANOS, Akeira Lahm J on: 02/24/2016 02:53 PM   Modules accepted: Orders

## 2016-02-24 NOTE — Patient Instructions (Signed)
Call or return to clinic prn if these symptoms worsen or fail to improve as anticipated.  Impingement Syndrome, Rotator Cuff, Bursitis With Rehab Impingement syndrome is a condition that involves inflammation of the tendons of the rotator cuff and the subacromial bursa, that causes pain in the shoulder. The rotator cuff consists of four tendons and muscles that control much of the shoulder and upper arm function. The subacromial bursa is a fluid filled sac that helps reduce friction between the rotator cuff and one of the bones of the shoulder (acromion). Impingement syndrome is usually an overuse injury that causes swelling of the bursa (bursitis), swelling of the tendon (tendonitis), and/or a tear of the tendon (strain). Strains are classified into three categories. Grade 1 strains cause pain, but the tendon is not lengthened. Grade 2 strains include a lengthened ligament, due to the ligament being stretched or partially ruptured. With grade 2 strains there is still function, although the function may be decreased. Grade 3 strains include a complete tear of the tendon or muscle, and function is usually impaired. SYMPTOMS   Pain around the shoulder, often at the outer portion of the upper arm.  Pain that gets worse with shoulder function, especially when reaching overhead or lifting.  Sometimes, aching when not using the arm.  Pain that wakes you up at night.  Sometimes, tenderness, swelling, warmth, or redness over the affected area.  Loss of strength.  Limited motion of the shoulder, especially reaching behind the back (to the back pocket or to unhook bra) or across your body.  Crackling sound (crepitation) when moving the arm.  Biceps tendon pain and inflammation (in the front of the shoulder). Worse when bending the elbow or lifting. CAUSES  Impingement syndrome is often an overuse injury, in which chronic (repetitive) motions cause the tendons or bursa to become inflamed. A strain  occurs when a force is paced on the tendon or muscle that is greater than it can withstand. Common mechanisms of injury include: Stress from sudden increase in duration, frequency, or intensity of training.  Direct hit (trauma) to the shoulder.  Aging, erosion of the tendon with normal use.  Bony bump on shoulder (acromial spur). RISK INCREASES WITH:  Contact sports (football, wrestling, boxing).  Throwing sports (baseball, tennis, volleyball).  Weightlifting and bodybuilding.  Heavy labor.  Previous injury to the rotator cuff, including impingement.  Poor shoulder strength and flexibility.  Failure to warm up properly before activity.  Inadequate protective equipment.  Old age.  Bony bump on shoulder (acromial spur). PREVENTION   Warm up and stretch properly before activity.  Allow for adequate recovery between workouts.  Maintain physical fitness:  Strength, flexibility, and endurance.  Cardiovascular fitness.  Learn and use proper exercise technique. PROGNOSIS  If treated properly, impingement syndrome usually goes away within 6 weeks. Sometimes surgery is required.  RELATED COMPLICATIONS   Longer healing time if not properly treated, or if not given enough time to heal.  Recurring symptoms, that result in a chronic condition.  Shoulder stiffness, frozen shoulder, or loss of motion.  Rotator cuff tendon tear.  Recurring symptoms, especially if activity is resumed too soon, with overuse, with a direct blow, or when using poor technique. TREATMENT  Treatment first involves the use of ice and medicine, to reduce pain and inflammation. The use of strengthening and stretching exercises may help reduce pain with activity. These exercises may be performed at home or with a therapist. If non-surgical treatment is unsuccessful after more than  6 months, surgery may be advised. After surgery and rehabilitation, activity is usually possible in 3 months.   MEDICATION  If pain medicine is needed, nonsteroidal anti-inflammatory medicines (aspirin and ibuprofen), or other minor pain relievers (acetaminophen), are often advised.  Do not take pain medicine for 7 days before surgery.  Prescription pain relievers may be given, if your caregiver thinks they are needed. Use only as directed and only as much as you need.  Corticosteroid injections may be given by your caregiver. These injections should be reserved for the most serious cases, because they may only be given a certain number of times. HEAT AND COLD  Cold treatment (icing) should be applied for 10 to 15 minutes every 2 to 3 hours for inflammation and pain, and immediately after activity that aggravates your symptoms. Use ice packs or an ice massage.  Heat treatment may be used before performing stretching and strengthening activities prescribed by your caregiver, physical therapist, or athletic trainer. Use a heat pack or a warm water soak. SEEK MEDICAL CARE IF:   Symptoms get worse or do not improve in 4 to 6 weeks, despite treatment.  New, unexplained symptoms develop. (Drugs used in treatment may produce side effects.) EXERCISES  RANGE OF MOTION (ROM) AND STRETCHING EXERCISES - Impingement Syndrome (Rotator Cuff  Tendinitis, Bursitis) These exercises may help you when beginning to rehabilitate your injury. Your symptoms may go away with or without further involvement from your physician, physical therapist or athletic trainer. While completing these exercises, remember:   Restoring tissue flexibility helps normal motion to return to the joints. This allows healthier, less painful movement and activity.  An effective stretch should be held for at least 30 seconds.  A stretch should never be painful. You should only feel a gentle lengthening or release in the stretched tissue. STRETCH - Flexion, Standing  Stand with good posture. With an underhand grip on your right / left hand,  and an overhand grip on the opposite hand, grasp a broomstick or cane so that your hands are a little more than shoulder width apart.  Keeping your right / left elbow straight and shoulder muscles relaxed, push the stick with your opposite hand, to raise your right / left arm in front of your body and then overhead. Raise your arm until you feel a stretch in your right / left shoulder, but before you have increased shoulder pain.  Try to avoid shrugging your right / left shoulder as your arm rises, by keeping your shoulder blade tucked down and toward your mid-back spine. Hold for __________ seconds.  Slowly return to the starting position. Repeat __________ times. Complete this exercise __________ times per day. STRETCH - Abduction, Supine  Lie on your back. With an underhand grip on your right / left hand and an overhand grip on the opposite hand, grasp a broomstick or cane so that your hands are a little more than shoulder width apart.  Keeping your right / left elbow straight and your shoulder muscles relaxed, push the stick with your opposite hand, to raise your right / left arm out to the side of your body and then overhead. Raise your arm until you feel a stretch in your right / left shoulder, but before you have increased shoulder pain.  Try to avoid shrugging your right / left shoulder as your arm rises, by keeping your shoulder blade tucked down and toward your mid-back spine. Hold for __________ seconds.  Slowly return to the starting position. Repeat  __________ times. Complete this exercise __________ times per day. ROM - Flexion, Active-Assisted  Lie on your back. You may bend your knees for comfort.  Grasp a broomstick or cane so your hands are about shoulder width apart. Your right / left hand should grip the end of the stick, so that your hand is positioned "thumbs-up," as if you were about to shake hands.  Using your healthy arm to lead, raise your right / left arm overhead,  until you feel a gentle stretch in your shoulder. Hold for __________ seconds.  Use the stick to assist in returning your right / left arm to its starting position. Repeat __________ times. Complete this exercise __________ times per day.  ROM - Internal Rotation, Supine   Lie on your back on a firm surface. Place your right / left elbow about 60 degrees away from your side. Elevate your elbow with a folded towel, so that the elbow and shoulder are the same height.  Using a broomstick or cane and your strong arm, pull your right / left hand toward your body until you feel a gentle stretch, but no increase in your shoulder pain. Keep your shoulder and elbow in place throughout the exercise.  Hold for __________ seconds. Slowly return to the starting position. Repeat __________ times. Complete this exercise __________ times per day. STRETCH - Internal Rotation  Place your right / left hand behind your back, palm up.  Throw a towel or belt over your opposite shoulder. Grasp the towel with your right / left hand.  While keeping an upright posture, gently pull up on the towel, until you feel a stretch in the front of your right / left shoulder.  Avoid shrugging your right / left shoulder as your arm rises, by keeping your shoulder blade tucked down and toward your mid-back spine.  Hold for __________ seconds. Release the stretch, by lowering your healthy hand. Repeat __________ times. Complete this exercise __________ times per day. ROM - Internal Rotation   Using an underhand grip, grasp a stick behind your back with both hands.  While standing upright with good posture, slide the stick up your back until you feel a mild stretch in the front of your shoulder.  Hold for __________ seconds. Slowly return to your starting position. Repeat __________ times. Complete this exercise __________ times per day.  STRETCH - Posterior Shoulder Capsule   Stand or sit with good posture. Grasp your  right / left elbow and draw it across your chest, keeping it at the same height as your shoulder.  Pull your elbow, so your upper arm comes in closer to your chest. Pull until you feel a gentle stretch in the back of your shoulder.  Hold for __________ seconds. Repeat __________ times. Complete this exercise __________ times per day. STRENGTHENING EXERCISES - Impingement Syndrome (Rotator Cuff Tendinitis, Bursitis) These exercises may help you when beginning to rehabilitate your injury. They may resolve your symptoms with or without further involvement from your physician, physical therapist or athletic trainer. While completing these exercises, remember:  Muscles can gain both the endurance and the strength needed for everyday activities through controlled exercises.  Complete these exercises as instructed by your physician, physical therapist or athletic trainer. Increase the resistance and repetitions only as guided.  You may experience muscle soreness or fatigue, but the pain or discomfort you are trying to eliminate should never worsen during these exercises. If this pain does get worse, stop and make sure you are following  the directions exactly. If the pain is still present after adjustments, discontinue the exercise until you can discuss the trouble with your clinician.  During your recovery, avoid activity or exercises which involve actions that place your injured hand or elbow above your head or behind your back or head. These positions stress the tissues which you are trying to heal. STRENGTH - Scapular Depression and Adduction   With good posture, sit on a firm chair. Support your arms in front of you, with pillows, arm rests, or on a table top. Have your elbows in line with the sides of your body.  Gently draw your shoulder blades down and toward your mid-back spine. Gradually increase the tension, without tensing the muscles along the top of your shoulders and the back of your  neck.  Hold for __________ seconds. Slowly release the tension and relax your muscles completely before starting the next repetition.  After you have practiced this exercise, remove the arm support and complete the exercise in standing as well as sitting position. Repeat __________ times. Complete this exercise __________ times per day.  STRENGTH - Shoulder Abductors, Isometric  With good posture, stand or sit about 4-6 inches from a wall, with your right / left side facing the wall.  Bend your right / left elbow. Gently press your right / left elbow into the wall. Increase the pressure gradually, until you are pressing as hard as you can, without shrugging your shoulder or increasing any shoulder discomfort.  Hold for __________ seconds.  Release the tension slowly. Relax your shoulder muscles completely before you begin the next repetition. Repeat __________ times. Complete this exercise __________ times per day.  STRENGTH - External Rotators, Isometric  Keep your right / left elbow at your side and bend it 90 degrees.  Step into a door frame so that the outside of your right / left wrist can press against the door frame without your upper arm leaving your side.  Gently press your right / left wrist into the door frame, as if you were trying to swing the back of your hand away from your stomach. Gradually increase the tension, until you are pressing as hard as you can, without shrugging your shoulder or increasing any shoulder discomfort.  Hold for __________ seconds.  Release the tension slowly. Relax your shoulder muscles completely before you begin the next repetition. Repeat __________ times. Complete this exercise __________ times per day.  STRENGTH - Supraspinatus   Stand or sit with good posture. Grasp a __________ weight, or an exercise band or tubing, so that your hand is "thumbs-up," like you are shaking hands.  Slowly lift your right / left arm in a "V" away from your  thigh, diagonally into the space between your side and straight ahead. Lift your hand to shoulder height or as far as you can, without increasing any shoulder pain. At first, many people do not lift their hands above shoulder height.  Avoid shrugging your right / left shoulder as your arm rises, by keeping your shoulder blade tucked down and toward your mid-back spine.  Hold for __________ seconds. Control the descent of your hand, as you slowly return to your starting position. Repeat __________ times. Complete this exercise __________ times per day.  STRENGTH - External Rotators  Secure a rubber exercise band or tubing to a fixed object (table, pole) so that it is at the same height as your right / left elbow when you are standing or sitting on a firm  surface.  Stand or sit so that the secured exercise band is at your uninjured side.  Bend your right / left elbow 90 degrees. Place a folded towel or small pillow under your right / left arm, so that your elbow is a few inches away from your side.  Keeping the tension on the exercise band, pull it away from your body, as if pivoting on your elbow. Be sure to keep your body steady, so that the movement is coming only from your rotating shoulder.  Hold for __________ seconds. Release the tension in a controlled manner, as you return to the starting position. Repeat __________ times. Complete this exercise __________ times per day.  STRENGTH - Internal Rotators   Secure a rubber exercise band or tubing to a fixed object (table, pole) so that it is at the same height as your right / left elbow when you are standing or sitting on a firm surface.  Stand or sit so that the secured exercise band is at your right / left side.  Bend your elbow 90 degrees. Place a folded towel or small pillow under your right / left arm so that your elbow is a few inches away from your side.  Keeping the tension on the exercise band, pull it across your body, toward  your stomach. Be sure to keep your body steady, so that the movement is coming only from your rotating shoulder.  Hold for __________ seconds. Release the tension in a controlled manner, as you return to the starting position. Repeat __________ times. Complete this exercise __________ times per day.  STRENGTH - Scapular Protractors, Standing   Stand arms length away from a wall. Place your hands on the wall, keeping your elbows straight.  Begin by dropping your shoulder blades down and toward your mid-back spine.  To strengthen your protractors, keep your shoulder blades down, but slide them forward on your rib cage. It will feel as if you are lifting the back of your rib cage away from the wall. This is a subtle motion and can be challenging to complete. Ask your caregiver for further instruction, if you are not sure you are doing the exercise correctly.  Hold for __________ seconds. Slowly return to the starting position, resting the muscles completely before starting the next repetition. Repeat __________ times. Complete this exercise __________ times per day. STRENGTH - Scapular Protractors, Supine  Lie on your back on a firm surface. Extend your right / left arm straight into the air while holding a __________ weight in your hand.  Keeping your head and back in place, lift your shoulder off the floor.  Hold for __________ seconds. Slowly return to the starting position, and allow your muscles to relax completely before starting the next repetition. Repeat __________ times. Complete this exercise __________ times per day. STRENGTH - Scapular Protractors, Quadruped  Get onto your hands and knees, with your shoulders directly over your hands (or as close as you can be, comfortably).  Keeping your elbows locked, lift the back of your rib cage up into your shoulder blades, so your mid-back rounds out. Keep your neck muscles relaxed.  Hold this position for __________ seconds. Slowly  return to the starting position and allow your muscles to relax completely before starting the next repetition. Repeat __________ times. Complete this exercise __________ times per day.  STRENGTH - Scapular Retractors  Secure a rubber exercise band or tubing to a fixed object (table, pole), so that it is at the height  of your shoulders when you are either standing, or sitting on a firm armless chair.  With a palm down grip, grasp an end of the band in each hand. Straighten your elbows and lift your hands straight in front of you, at shoulder height. Step back, away from the secured end of the band, until it becomes tense.  Squeezing your shoulder blades together, draw your elbows back toward your sides, as you bend them. Keep your upper arms lifted away from your body throughout the exercise.  Hold for __________ seconds. Slowly ease the tension on the band, as you reverse the directions and return to the starting position. Repeat __________ times. Complete this exercise __________ times per day. STRENGTH - Shoulder Extensors   Secure a rubber exercise band or tubing to a fixed object (table, pole) so that it is at the height of your shoulders when you are either standing, or sitting on a firm armless chair.  With a thumbs-up grip, grasp an end of the band in each hand. Straighten your elbows and lift your hands straight in front of you, at shoulder height. Step back, away from the secured end of the band, until it becomes tense.  Squeezing your shoulder blades together, pull your hands down to the sides of your thighs. Do not allow your hands to go behind you.  Hold for __________ seconds. Slowly ease the tension on the band, as you reverse the directions and return to the starting position. Repeat __________ times. Complete this exercise __________ times per day.  STRENGTH - Scapular Retractors and External Rotators   Secure a rubber exercise band or tubing to a fixed object (table, pole)  so that it is at the height as your shoulders, when you are either standing, or sitting on a firm armless chair.  With a palm down grip, grasp an end of the band in each hand. Bend your elbows 90 degrees and lift your elbows to shoulder height, at your sides. Step back, away from the secured end of the band, until it becomes tense.  Squeezing your shoulder blades together, rotate your shoulders so that your upper arms and elbows remain stationary, but your fists travel upward to head height.  Hold for __________ seconds. Slowly ease the tension on the band, as you reverse the directions and return to the starting position. Repeat __________ times. Complete this exercise __________ times per day.  STRENGTH - Scapular Retractors and External Rotators, Rowing   Secure a rubber exercise band or tubing to a fixed object (table, pole) so that it is at the height of your shoulders, when you are either standing, or sitting on a firm armless chair.  With a palm down grip, grasp an end of the band in each hand. Straighten your elbows and lift your hands straight in front of you, at shoulder height. Step back, away from the secured end of the band, until it becomes tense.  Step 1: Squeeze your shoulder blades together. Bending your elbows, draw your hands to your chest, as if you are rowing a boat. At the end of this motion, your hands and elbow should be at shoulder height and your elbows should be out to your sides.  Step 2: Rotate your shoulders, to raise your hands above your head. Your forearms should be vertical and your upper arms should be horizontal.  Hold for __________ seconds. Slowly ease the tension on the band, as you reverse the directions and return to the starting position. Repeat __________  times. Complete this exercise __________ times per day.  STRENGTH - Scapular Depressors  Find a sturdy chair without wheels, such as a dining room chair.  Keeping your feet on the floor, and your  hands on the chair arms, lift your bottom up from the seat, and lock your elbows.  Keeping your elbows straight, allow gravity to pull your body weight down. Your shoulders will rise toward your ears.  Raise your body against gravity by drawing your shoulder blades down your back, shortening the distance between your shoulders and ears. Although your feet should always maintain contact with the floor, your feet should progressively support less body weight, as you get stronger.  Hold for __________ seconds. In a controlled and slow manner, lower your body weight to begin the next repetition. Repeat __________ times. Complete this exercise __________ times per day.    This information is not intended to replace advice given to you by your health care provider. Make sure you discuss any questions you have with your health care provider.   Document Released: 10/18/2005 Document Revised: 11/08/2014 Document Reviewed: 01/30/2009 [employer] Interactive Patient Education [employer]

## 2016-02-24 NOTE — Progress Notes (Signed)
Pre visit review using our clinic review tool, if applicable. No additional management support is needed unless otherwise documented below in the visit note. 

## 2016-04-08 ENCOUNTER — Other Ambulatory Visit: Payer: Self-pay | Admitting: Internal Medicine

## 2016-04-09 ENCOUNTER — Other Ambulatory Visit: Payer: Self-pay | Admitting: Internal Medicine

## 2016-06-16 ENCOUNTER — Other Ambulatory Visit: Payer: Self-pay | Admitting: Internal Medicine

## 2016-06-16 NOTE — Telephone Encounter (Signed)
Okay to refill? 

## 2016-09-27 ENCOUNTER — Other Ambulatory Visit: Payer: Self-pay | Admitting: Internal Medicine

## 2016-12-19 ENCOUNTER — Other Ambulatory Visit: Payer: Self-pay | Admitting: Internal Medicine

## 2017-01-20 ENCOUNTER — Telehealth: Payer: Self-pay | Admitting: Internal Medicine

## 2017-01-20 ENCOUNTER — Other Ambulatory Visit: Payer: Self-pay | Admitting: Internal Medicine

## 2017-01-20 MED ORDER — TRAMADOL HCL 50 MG PO TABS: 50.0000 mg | ORAL_TABLET | Freq: Four times a day (QID) | ORAL | 0 refills | Status: DC

## 2017-01-20 NOTE — Telephone Encounter (Signed)
Rx refill for Tramadol 50 mg was sent to [facility] in [place]

## 2017-01-20 NOTE — Telephone Encounter (Signed)
Pt would like new rx tramadol 50 mg send [facility] [address] [place] [id number] phone [phone]. Pt did not pick up refill at [facility] [place]. Pt has moved. Pt is aware md out of office until wed

## 2017-01-24 ENCOUNTER — Telehealth: Payer: Self-pay | Admitting: Internal Medicine

## 2017-01-24 NOTE — Telephone Encounter (Signed)
See message below, please advise if it is ok to re-call in tramadol Rx to pharmacy

## 2017-01-24 NOTE — Telephone Encounter (Signed)
Pt calling stating that she has possible thrown her Tramadol in the trash by accident.  She picked it up at the pharmacy on Saturday and is unable to locate it since she has been moving.

## 2017-01-26 ENCOUNTER — Telehealth: Payer: Self-pay | Admitting: Internal Medicine

## 2017-01-26 MED ORDER — TRAMADOL HCL 50 MG PO TABS: 50.0000 mg | ORAL_TABLET | Freq: Four times a day (QID) | ORAL | 0 refills | Status: DC

## 2017-01-26 NOTE — Telephone Encounter (Signed)
Pharm would like to know if they can refill tramadol early. Pt accidentally through med away

## 2017-01-26 NOTE — Telephone Encounter (Signed)
Tramadol called in to pt's pharmacy.

## 2017-01-26 NOTE — Telephone Encounter (Signed)
Tramadol refill was approved per Dr Kirtland BouchardK, medication was called in to pt's pharmacy

## 2017-01-26 NOTE — Telephone Encounter (Signed)
Okay to refill tramadol.

## 2017-01-31 ENCOUNTER — Other Ambulatory Visit: Payer: Self-pay

## 2017-02-14 ENCOUNTER — Other Ambulatory Visit: Payer: Self-pay | Admitting: Internal Medicine

## 2017-06-10 ENCOUNTER — Other Ambulatory Visit: Payer: Self-pay | Admitting: Internal Medicine

## 2017-07-11 ENCOUNTER — Ambulatory Visit: Payer: Self-pay | Admitting: Internal Medicine

## 2017-07-15 ENCOUNTER — Ambulatory Visit: Payer: Self-pay | Admitting: Internal Medicine

## 2017-07-20 ENCOUNTER — Ambulatory Visit: Payer: Self-pay | Admitting: Internal Medicine

## 2017-07-21 ENCOUNTER — Encounter: Payer: Self-pay | Admitting: Internal Medicine

## 2017-07-25 ENCOUNTER — Other Ambulatory Visit: Payer: Self-pay | Admitting: Emergency Medicine

## 2017-07-25 ENCOUNTER — Ambulatory Visit (INDEPENDENT_AMBULATORY_CARE_PROVIDER_SITE_OTHER): Payer: Self-pay | Admitting: Internal Medicine

## 2017-07-25 ENCOUNTER — Encounter: Payer: Self-pay | Admitting: Internal Medicine

## 2017-07-25 VITALS — BP 126/78 | HR 67 | Temp 98.1°F | Wt 228.4 lb

## 2017-07-25 DIAGNOSIS — R7309 Other abnormal glucose: Secondary | ICD-10-CM

## 2017-07-25 DIAGNOSIS — E039 Hypothyroidism, unspecified: Secondary | ICD-10-CM

## 2017-07-25 DIAGNOSIS — Z6835 Body mass index (BMI) 35.0-35.9, adult: Secondary | ICD-10-CM

## 2017-07-25 DIAGNOSIS — I10 Essential (primary) hypertension: Secondary | ICD-10-CM

## 2017-07-25 LAB — CBC WITH DIFFERENTIAL/PLATELET
BASOS ABS: 0 10*3/uL (ref 0.0–0.1)
Basophils Relative: 0.3 % (ref 0.0–3.0)
EOS PCT: 1.6 % (ref 0.0–5.0)
Eosinophils Absolute: 0.1 10*3/uL (ref 0.0–0.7)
HCT: 38.8 % (ref 36.0–46.0)
HEMOGLOBIN: 12.8 g/dL (ref 12.0–15.0)
Lymphocytes Relative: 45.8 % (ref 12.0–46.0)
Lymphs Abs: 2.9 10*3/uL (ref 0.7–4.0)
MCHC: 33.1 g/dL (ref 30.0–36.0)
MCV: 89.8 fl (ref 78.0–100.0)
MONOS PCT: 6 % (ref 3.0–12.0)
Monocytes Absolute: 0.4 10*3/uL (ref 0.1–1.0)
Neutro Abs: 2.9 10*3/uL (ref 1.4–7.7)
Neutrophils Relative %: 46.3 % (ref 43.0–77.0)
Platelets: 233 10*3/uL (ref 150.0–400.0)
RBC: 4.32 Mil/uL (ref 3.87–5.11)
RDW: 13.3 % (ref 11.5–15.5)
WBC: 6.3 10*3/uL (ref 4.0–10.5)

## 2017-07-25 LAB — COMPREHENSIVE METABOLIC PANEL
ALK PHOS: 79 U/L (ref 39–117)
ALT: 193 U/L — ABNORMAL HIGH (ref 0–35)
AST: 98 U/L — ABNORMAL HIGH (ref 0–37)
Albumin: 4 g/dL (ref 3.5–5.2)
BUN: 13 mg/dL (ref 6–23)
CO2: 30 mEq/L (ref 19–32)
Calcium: 9.6 mg/dL (ref 8.4–10.5)
Chloride: 99 mEq/L (ref 96–112)
Creatinine, Ser: 0.97 mg/dL (ref 0.40–1.20)
GFR: 64.17 mL/min (ref >60.00)
Glucose, Bld: 241 mg/dL — ABNORMAL HIGH (ref 70–99)
POTASSIUM: 4 meq/L (ref 3.5–5.1)
Sodium: 138 mEq/L (ref 135–145)
Total Bilirubin: 0.5 mg/dL (ref 0.2–1.2)
Total Protein: 6.8 g/dL (ref 6.0–8.3)

## 2017-07-25 LAB — LIPID PANEL
CHOLESTEROL: 197 mg/dL (ref 0–200)
HDL: 53.9 mg/dL (ref >39.00)
LDL Cholesterol: 123 mg/dL — ABNORMAL HIGH (ref 0–99)
NonHDL: 143.32
Total CHOL/HDL Ratio: 4
Triglycerides: 100 mg/dL (ref 0.0–149.0)
VLDL: 20 mg/dL (ref 0.0–40.0)

## 2017-07-25 LAB — HEMOGLOBIN A1C: HEMOGLOBIN A1C: 7.8 % — AB (ref 4.6–6.5)

## 2017-07-25 LAB — TSH: TSH: 1.9 u[IU]/mL (ref 0.35–4.50)

## 2017-07-25 MED ORDER — TRAMADOL HCL 50 MG PO TABS
50.0000 mg | ORAL_TABLET | Freq: Four times a day (QID) | ORAL | 0 refills | Status: DC
Start: 2017-07-25 — End: 2017-10-11

## 2017-07-25 MED ORDER — THYROID 120 MG PO TABS: 120.0000 mg | ORAL_TABLET | Freq: Every day | ORAL | 3 refills | Status: DC

## 2017-07-25 MED ORDER — LISINOPRIL-HYDROCHLOROTHIAZIDE 20-25 MG PO TABS: 1.0000 | ORAL_TABLET | Freq: Every day | ORAL | 3 refills | Status: DC

## 2017-07-25 NOTE — Addendum Note (Signed)
Addended by: Charna Elizabeth on: 07/25/2017 04:47 PM   Modules accepted: Orders

## 2017-07-25 NOTE — Progress Notes (Addendum)
Subjective:    Patient ID: Tanya Mcguire, female    DOB: 23-Nov-1965, 51 y.o.   MRN: [id number]  HPI 51 year old patient who has a history of essential hypertension and hypothyroidism.  She is seen today for medicine review and general follow-up. She has a history of exogenous obesity  No recent laboratory studies  Wt Readings from Last 3 Encounters:  07/25/17 228 lb 6.4 oz (103.6 kg)  02/24/16 205 lb (93 kg)  11/11/14 220 lb 3.2 oz (99.9 kg)    Past Medical History:  Diagnosis Date  . ADD (attention deficit disorder with hyperactivity)   . ANXIETY 06/08/2007  . DEPRESSION 06/08/2007  . Heel pain    left  . HYPERTENSION, BENIGN ESSENTIAL 02/19/2008  . HYPOTHYROIDISM 06/08/2007     Social History   Social History  . Marital status: Married    Spouse name: N/A  . Number of children: N/A  . Years of education: N/A   Occupational History  . Not on file.   Social History Main Topics  . Smoking status: Never Smoker  . Smokeless tobacco: Never Used  . Alcohol use Yes     Comment: occasionally  . Drug use: Unknown  . Sexual activity: Not on file   Other Topics Concern  . Not on file   Social History Narrative  . No narrative on file    No past surgical history on file.  Family History  Problem Relation Age of Onset  . Diabetes Mother   . Heart disease Father   . Leukemia Father     Allergies  Allergen Reactions  . Ibuprofen Swelling    No current outpatient prescriptions on file prior to visit.   No current facility-administered medications on file prior to visit.     BP 126/78 (BP Location: Right Arm, Patient Position: Sitting, Cuff Size: Large)   Pulse 67   Temp 98.1 F (36.7 C) (Oral)   Wt 228 lb 6.4 oz (103.6 kg)   BMI 36.86 kg/m      Review of Systems  Constitutional: Negative.   HENT: Negative for congestion, dental problem, hearing loss, rhinorrhea, sinus pressure, sore throat and tinnitus.   Eyes: Negative for pain, discharge and  visual disturbance.  Respiratory: Negative for cough and shortness of breath.   Cardiovascular: Negative for chest pain, palpitations and leg swelling.  Gastrointestinal: Negative for abdominal distention, abdominal pain, blood in stool, constipation, diarrhea, nausea and vomiting.  Genitourinary: Negative for difficulty urinating, dysuria, flank pain, frequency, hematuria, pelvic pain, urgency, vaginal bleeding, vaginal discharge and vaginal pain.  Musculoskeletal: Negative for arthralgias, gait problem and joint swelling.  Skin: Negative for rash.  Neurological: Negative for dizziness, syncope, speech difficulty, weakness, numbness and headaches.  Hematological: Negative for adenopathy.  Psychiatric/Behavioral: Negative for agitation, behavioral problems and dysphoric mood. The patient is not nervous/anxious.        Objective:   Physical Exam  Constitutional: She is oriented to person, place, and time. She appears well-developed and well-nourished.  Weight 228 Blood pressure well controlled  HENT:  Head: Normocephalic.  Right Ear: External ear normal.  Left Ear: External ear normal.  Mouth/Throat: Oropharynx is clear and moist.  Eyes: Pupils are equal, round, and reactive to light. Conjunctivae and EOM are normal.  Neck: Normal range of motion. Neck supple. No thyromegaly present.  Cardiovascular: Normal rate, regular rhythm, normal heart sounds and intact distal pulses.   Pulmonary/Chest: Effort normal and breath sounds normal.  Abdominal: Soft. Bowel sounds are  normal. She exhibits no mass. There is no tenderness.  Musculoskeletal: Normal range of motion.  Lymphadenopathy:    She has no cervical adenopathy.  Neurological: She is alert and oriented to person, place, and time.  Skin: Skin is warm and dry. No rash noted.  Psychiatric: She has a normal mood and affect. Her behavior is normal.          Assessment & Plan:   Essential hypertension,  well-controlled Hypothyroidism.  We'll check updated lab including TSH Obesity.  Dietary instructions discussed.  Patient information supplied  All medications updated CPX one year  Rogelia Boga   Addendum addendum:  Patient noted to have random blood sugar greater than 200.  This was discussed with the patient.  She will drop by the office at her convenience to pick up a glucometer and further diabetic information  Rogelia Boga

## 2017-07-25 NOTE — Patient Instructions (Addendum)
Limit your sodium (Salt) intake  Please check your blood pressure on a regular basis.  If it is consistently greater than 150/90, please make an office appointment.   DASH Eating Plan DASH stands for "Dietary Approaches to Stop Hypertension." The DASH eating plan is a healthy eating plan that has been shown to reduce high blood pressure (hypertension). It may also reduce your risk for type 2 diabetes, heart disease, and stroke. The DASH eating plan may also help with weight loss. What are tips for following this plan? General guidelines  Avoid eating more than 2,300 mg (milligrams) of salt (sodium) a day. If you have hypertension, you may need to reduce your sodium intake to 1,500 mg a day.  Limit alcohol intake to no more than 1 drink a day for nonpregnant women and 2 drinks a day for men. One drink equals 12 oz of beer, 5 oz of wine, or 1 oz of hard liquor.  Work with your health care provider to maintain a healthy body weight or to lose weight. Ask what an ideal weight is for you.  Get at least 30 minutes of exercise that causes your heart to beat faster (aerobic exercise) most days of the week. Activities may include walking, swimming, or biking.  Work with your health care provider or diet and nutrition specialist (dietitian) to adjust your eating plan to your individual calorie needs. Reading food labels  Check food labels for the amount of sodium per serving. Choose foods with less than 5 percent of the Daily Value of sodium. Generally, foods with less than 300 mg of sodium per serving fit into this eating plan.  To find whole grains, look for the word "whole" as the first word in the ingredient list. Shopping  Buy products labeled as "low-sodium" or "no salt added."  Buy fresh foods. Avoid canned foods and premade or frozen meals. Cooking  Avoid adding salt when cooking. Use salt-free seasonings or herbs instead of table salt or sea salt. Check with your health care provider  or pharmacist before using salt substitutes.  Do not fry foods. Cook foods using healthy methods such as baking, boiling, grilling, and broiling instead.  Cook with heart-healthy oils, such as olive, canola, soybean, or sunflower oil. Meal planning   Eat a balanced diet that includes: ? 5 or more servings of fruits and vegetables each day. At each meal, try to fill half of your plate with fruits and vegetables. ? Up to 6-8 servings of whole grains each day. ? Less than 6 oz of lean meat, poultry, or fish each day. A 3-oz serving of meat is about the same size as a deck of cards. One egg equals 1 oz. ? 2 servings of low-fat dairy each day. ? A serving of nuts, seeds, or beans 5 times each week. ? Heart-healthy fats. Healthy fats called Omega-3 fatty acids are found in foods such as flaxseeds and coldwater fish, like sardines, salmon, and mackerel.  Limit how much you eat of the following: ? Canned or prepackaged foods. ? Food that is high in trans fat, such as fried foods. ? Food that is high in saturated fat, such as fatty meat. ? Sweets, desserts, sugary drinks, and other foods with added sugar. ? Full-fat dairy products.  Do not salt foods before eating.  Try to eat at least 2 vegetarian meals each week.  Eat more home-cooked food and less restaurant, buffet, and fast food.  When eating at a restaurant, ask that your  food be prepared with less salt or no salt, if possible. What foods are recommended? The items listed may not be a complete list. Talk with your dietitian about what dietary choices are best for you. Grains Whole-grain or whole-wheat bread. Whole-grain or whole-wheat pasta. Brown rice. [place]. Bulgur. Whole-grain and low-sodium cereals. Pita bread. Low-fat, low-sodium crackers. Whole-wheat flour tortillas. Vegetables Fresh or frozen vegetables (raw, steamed, roasted, or grilled). Low-sodium or reduced-sodium tomato and vegetable juice. Low-sodium or  reduced-sodium tomato sauce and tomato paste. Low-sodium or reduced-sodium canned vegetables. Fruits All fresh, dried, or frozen fruit. Canned fruit in natural juice (without added sugar). Meat and other protein foods Skinless chicken or [place]. Ground chicken or [place]. Pork with fat trimmed off. Fish and seafood. Egg whites. Dried beans, peas, or lentils. Unsalted nuts, nut butters, and seeds. Unsalted canned beans. Lean cuts of beef with fat trimmed off. Low-sodium, lean deli meat. Dairy Low-fat (1%) or fat-free (skim) milk. Fat-free, low-fat, or reduced-fat cheeses. Nonfat, low-sodium ricotta or cottage cheese. Low-fat or nonfat yogurt. Low-fat, low-sodium cheese. Fats and oils Soft margarine without trans fats. Vegetable oil. Low-fat, reduced-fat, or light mayonnaise and salad dressings (reduced-sodium). Canola, safflower, olive, soybean, and sunflower oils. Avocado. Seasoning and other foods Herbs. Spices. Seasoning mixes without salt. Unsalted popcorn and pretzels. Fat-free sweets. What foods are not recommended? The items listed may not be a complete list. Talk with your dietitian about what dietary choices are best for you. Grains Baked goods made with fat, such as croissants, muffins, or some breads. Dry pasta or rice meal packs. Vegetables Creamed or fried vegetables. Vegetables in a cheese sauce. Regular canned vegetables (not low-sodium or reduced-sodium). Regular canned tomato sauce and paste (not low-sodium or reduced-sodium). Regular tomato and vegetable juice (not low-sodium or reduced-sodium). Angie Fava. Olives. Fruits Canned fruit in a light or heavy syrup. Fried fruit. Fruit in cream or butter sauce. Meat and other protein foods Fatty cuts of meat. Ribs. Fried meat. Berniece Salines. Sausage. Bologna and other processed lunch meats. Salami. Fatback. Hotdogs. Bratwurst. Salted nuts and seeds. Canned beans with added salt. Canned or smoked fish. Whole eggs or egg yolks. Chicken or [place]  with skin. Dairy Whole or 2% milk, cream, and half-and-half. Whole or full-fat cream cheese. Whole-fat or sweetened yogurt. Full-fat cheese. Nondairy creamers. Whipped toppings. Processed cheese and cheese spreads. Fats and oils Butter. Stick margarine. Lard. Shortening. Ghee. Bacon fat. Tropical oils, such as coconut, palm kernel, or palm oil. Seasoning and other foods Salted popcorn and pretzels. Onion salt, garlic salt, seasoned salt, table salt, and sea salt. Worcestershire sauce. Tartar sauce. Barbecue sauce. Teriyaki sauce. Soy sauce, including reduced-sodium. Steak sauce. Canned and packaged gravies. Fish sauce. Oyster sauce. Cocktail sauce. Horseradish that you find on the shelf. Ketchup. Mustard. Meat flavorings and tenderizers. Bouillon cubes. Hot sauce and Tabasco sauce. Premade or packaged marinades. Premade or packaged taco seasonings. Relishes. Regular salad dressings. Where to find more information:  National Heart, Lung, and [place]: https://wilson-eaton.com/  American Heart Association: www.heart.org Summary  The DASH eating plan is a healthy eating plan that has been shown to reduce high blood pressure (hypertension). It may also reduce your risk for type 2 diabetes, heart disease, and stroke.  With the DASH eating plan, you should limit salt (sodium) intake to 2,300 mg a day. If you have hypertension, you may need to reduce your sodium intake to 1,500 mg a day.  When on the DASH eating plan, aim to eat more fresh fruits and vegetables,  whole grains, lean proteins, low-fat dairy, and heart-healthy fats.  Work with your health care provider or diet and nutrition specialist (dietitian) to adjust your eating plan to your individual calorie needs. This information is not intended to replace advice given to you by your health care provider. Make sure you discuss any questions you have with your health care provider. Document Released: 10/07/2011 Document Revised: 10/11/2016  Document Reviewed: 10/11/2016 Elsevier Interactive Patient Education  2017 [place] refers to food and lifestyle choices that are based on the traditions of countries located on the [employer]. This way of eating has been shown to help prevent certain conditions and improve outcomes for people who have chronic diseases, like kidney disease and heart disease. What are tips for following this plan? Lifestyle  Cook and eat meals together with your family, when possible.  Drink enough fluid to keep your urine clear or pale yellow.  Be physically active every day. This includes: ? Aerobic exercise like running or swimming. ? Leisure activities like gardening, walking, or housework.  Get 7-8 hours of sleep each night.  If recommended by your health care provider, drink red wine in moderation. This means 1 glass a day for nonpregnant women and 2 glasses a day for men. A glass of wine equals 5 oz (150 mL). Reading food labels  Check the serving size of packaged foods. For foods such as rice and pasta, the serving size refers to the amount of cooked product, not dry.  Check the total fat in packaged foods. Avoid foods that have saturated fat or trans fats.  Check the ingredients list for added sugars, such as corn syrup. Shopping  At the grocery store, buy most of your food from the areas near the walls of the store. This includes: ? Fresh fruits and vegetables (produce). ? Grains, beans, nuts, and seeds. Some of these may be available in unpackaged forms or large amounts (in bulk). ? Fresh seafood. ? Poultry and eggs. ? Low-fat dairy products.  Buy whole ingredients instead of prepackaged foods.  Buy fresh fruits and vegetables in-season from local farmers markets.  Buy frozen fruits and vegetables in resealable bags.  If you do not have access to quality fresh seafood, buy precooked frozen shrimp or canned fish, such as tuna,  salmon, or sardines.  Buy small amounts of raw or cooked vegetables, salads, or olives from the deli or salad bar at your store.  Stock your pantry so you always have certain foods on hand, such as olive oil, canned tuna, canned tomatoes, rice, pasta, and beans. Cooking  Cook foods with extra-virgin olive oil instead of using butter or other vegetable oils.  Have meat as a side dish, and have vegetables or grains as your main dish. This means having meat in small portions or adding small amounts of meat to foods like pasta or stew.  Use beans or vegetables instead of meat in common dishes like chili or lasagna.  Experiment with different cooking methods. Try roasting or broiling vegetables instead of steaming or sauteing them.  Add frozen vegetables to soups, stews, pasta, or rice.  Add nuts or seeds for added healthy fat at each meal. You can add these to yogurt, salads, or vegetable dishes.  Marinate fish or vegetables using olive oil, lemon juice, garlic, and fresh herbs. Meal planning  Plan to eat 1 vegetarian meal one day each week. Try to work up to 2 vegetarian meals, if possible.  Eat seafood 2 or more times a week.  Have healthy snacks readily available, such as: ? Vegetable sticks with hummus. ? [place] yogurt. ? Fruit and nut trail mix.  Eat balanced meals throughout the week. This includes: ? Fruit: 2-3 servings a day ? Vegetables: 4-5 servings a day ? Low-fat dairy: 2 servings a day ? Fish, poultry, or lean meat: 1 serving a day ? Beans and legumes: 2 or more servings a week ? Nuts and seeds: 1-2 servings a day ? Whole grains: 6-8 servings a day ? Extra-virgin olive oil: 3-4 servings a day  Limit red meat and sweets to only a few servings a month What are my food choices?  Mediterranean diet ? Recommended ? Grains: Whole-grain pasta. Brown rice. Bulgar wheat. Polenta. Couscous. Whole-wheat bread. [place]. ? Vegetables: Artichokes. Beets. Broccoli.  Cabbage. Carrots. Eggplant. Green beans. Chard. Kale. Spinach. Onions. Leeks. Peas. Squash. Tomatoes. Peppers. Radishes. ? Fruits: Apples. Apricots. Avocado. Berries. Bananas. Cherries. Dates. Figs. Grapes. Lemons. Melon. Oranges. Peaches. Plums. Pomegranate. ? Meats and other protein foods: Beans. Almonds. Sunflower seeds. Pine nuts. Peanuts. [place]. Salmon. Scallops. Shrimp. [place]. Tilapia. Clams. Oysters. Eggs. ? Dairy: Low-fat milk. Cheese. Greek yogurt. ? Beverages: Water. Red wine. Herbal tea. ? Fats and oils: Extra virgin olive oil. Avocado oil. Grape seed oil. ? Sweets and desserts: [place] yogurt with honey. Baked apples. Poached pears. Trail mix. ? Seasoning and other foods: Basil. Cilantro. Coriander. Cumin. Mint. Parsley. Sage. Rosemary. Tarragon. Garlic. Oregano. Thyme. Pepper. Balsalmic vinegar. Tahini. Hummus. Tomato sauce. Olives. Mushrooms. ? Limit these ? Grains: Prepackaged pasta or rice dishes. Prepackaged cereal with added sugar. ? Vegetables: Deep fried potatoes (french fries). ? Fruits: Fruit canned in syrup. ? Meats and other protein foods: Beef. Pork. Lamb. Poultry with skin. Hot dogs. Berniece Salines. ? Dairy: Ice cream. Sour cream. Whole milk. ? Beverages: Juice. Sugar-sweetened soft drinks. Beer. Liquor and spirits. ? Fats and oils: Butter. Canola oil. Vegetable oil. Beef fat (tallow). Lard. ? Sweets and desserts: Cookies. Cakes. Pies. Candy. ? Seasoning and other foods: Mayonnaise. Premade sauces and marinades. ? The items listed may not be a complete list. Talk with your dietitian about what dietary choices are right for you. Summary  The Mediterranean diet includes both food and lifestyle choices.  Eat a variety of fresh fruits and vegetables, beans, nuts, seeds, and whole grains.  Limit the amount of red meat and sweets that you eat.  Talk with your health care provider about whether it is safe for you to drink red wine in moderation. This means 1 glass a day for  nonpregnant women and 2 glasses a day for men. A glass of wine equals 5 oz (150 mL). This information is not intended to replace advice given to you by your health care provider. Make sure you discuss any questions you have with your health care provider. Document Released: 06/10/2016 Document Revised: 07/13/2016 Document Reviewed: 06/10/2016 Elsevier Interactive Patient Education  2018 [place].  Blood Glucose Monitoring, Adult Monitoring your blood sugar (glucose) helps you manage your diabetes. It also helps you and your health care provider determine how well your diabetes management plan is working. Blood glucose monitoring involves checking your blood glucose as often as directed, and keeping a record (log) of your results over time. Why should I monitor my blood glucose? Checking your blood glucose regularly can:  Help you understand how food, exercise, illnesses, and medicines affect your blood glucose.  Let you know what your blood glucose is  at any time. You can quickly tell if you are having low blood glucose (hypoglycemia) or high blood glucose (hyperglycemia).  Help you and your health care provider adjust your medicines as needed.  When should I check my blood glucose? Follow instructions from your health care provider about how often to check your blood glucose. This may depend on:  The type of diabetes you have.  How well-controlled your diabetes is.  Medicines you are taking.  If you have type 1 diabetes:  Check your blood glucose at least 2 times a day.  Also check your blood glucose: ? Before every insulin injection. ? Before and after exercise. ? Between meals. ? 2 hours after a meal. ? Occasionally between 2:00 a.m. and 3:00 a.m., as directed. ? Before potentially dangerous tasks, like driving or using heavy machinery. ? At bedtime.  You may need to check your blood glucose more often, up to 6-10 times a day: ? If you use an insulin pump. ? If you  need multiple daily injections (MDI). ? If your diabetes is not well-controlled. ? If you are ill. ? If you have a history of severe hypoglycemia. ? If you have a history of not knowing when your blood glucose is getting low (hypoglycemia unawareness). If you have type 2 diabetes:  If you take insulin or other diabetes medicines, check your blood glucose at least 2 times a day.  If you are on intensive insulin therapy, check your blood glucose at least 4 times a day. Occasionally, you may also need to check between 2:00 a.m. and 3:00 a.m., as directed.  Also check your blood glucose: ? Before and after exercise. ? Before potentially dangerous tasks, like driving or using heavy machinery.  You may need to check your blood glucose more often if: ? Your medicine is being adjusted. ? Your diabetes is not well-controlled. ? You are ill. What is a blood glucose log?  A blood glucose log is a record of your blood glucose readings. It helps you and your health care provider: ? Look for patterns in your blood glucose over time. ? Adjust your diabetes management plan as needed.  Every time you check your blood glucose, write down your result and notes about things that may be affecting your blood glucose, such as your diet and exercise for the day.  Most glucose meters store a record of glucose readings in the meter. Some meters allow you to download your records to a computer. How do I check my blood glucose? Follow these steps to get accurate readings of your blood glucose: Supplies needed   Blood glucose meter.  Test strips for your meter. Each meter has its own strips. You must use the strips that come with your meter.  A needle to prick your finger (lancet). Do not use lancets more than once.  A device that holds the lancet (lancing device).  A journal or log book to write down your results. Procedure  Wash your hands with soap and water.  Prick the side of your finger (not  the tip) with the lancet. Use a different finger each time.  Gently rub the finger until a small drop of blood appears.  Follow instructions that come with your meter for inserting the test strip, applying blood to the strip, and using your blood glucose meter.  Write down your result and any notes. Alternative testing sites  Some meters allow you to use areas of your body other than your finger (alternative  sites) to test your blood.  If you think you may have hypoglycemia, or if you have hypoglycemia unawareness, do not use alternative sites. Use your finger instead.  Alternative sites may not be as accurate as the fingers, because blood flow is slower in these areas. This means that the result you get may be delayed, and it may be different from the result that you would get from your finger.  The most common alternative sites are: ? Forearm. ? Thigh. ? Palm of the hand. Additional tips  Always keep your supplies with you.  If you have questions or need help, all blood glucose meters have a 24-hour "hotline" number that you can call. You may also contact your health care provider.  After you use a few boxes of test strips, adjust (calibrate) your blood glucose meter by following instructions that came with your meter. This information is not intended to replace advice given to you by your health care provider. Make sure you discuss any questions you have with your health care provider. Document Released: 10/21/2003 Document Revised: 05/07/2016 Document Reviewed: 03/29/2016 Elsevier Interactive Patient Education  2017 [place].  Diabetes Mellitus and Exercise Exercising regularly is important for your overall health, especially when you have diabetes (diabetes mellitus). Exercising is not only about losing weight. It has many health benefits, such as increasing muscle strength and bone density and reducing body fat and stress. This leads to improved fitness, flexibility, and  endurance, all of which result in better overall health. Exercise has additional benefits for people with diabetes, including:  Reducing appetite.  Helping to lower and control blood glucose.  Lowering blood pressure.  Helping to control amounts of fatty substances (lipids) in the blood, such as cholesterol and triglycerides.  Helping the body to respond better to insulin (improving insulin sensitivity).  Reducing how much insulin the body needs.  Decreasing the risk for heart disease by: ? Lowering cholesterol and triglyceride levels. ? Increasing the levels of good cholesterol. ? Lowering blood glucose levels.  What is my activity plan? Your health care provider or certified diabetes educator can help you make a plan for the type and frequency of exercise (activity plan) that works for you. Make sure that you:  Do at least 150 minutes of moderate-intensity or vigorous-intensity exercise each week. This could be brisk walking, biking, or water aerobics. ? Do stretching and strength exercises, such as yoga or weightlifting, at least 2 times a week. ? Spread out your activity over at least 3 days of the week.  Get some form of physical activity every day. ? Do not go more than 2 days in a row without some kind of physical activity. ? Avoid being inactive for more than 90 minutes at a time. Take frequent breaks to walk or stretch.  Choose a type of exercise or activity that you enjoy, and set realistic goals.  Start slowly, and gradually increase the intensity of your exercise over time.  What do I need to know about managing my diabetes?  Check your blood glucose before and after exercising. ? If your blood glucose is higher than 240 mg/dL (13.3 mmol/L) before you exercise, check your urine for ketones. If you have ketones in your urine, do not exercise until your blood glucose returns to normal.  Know the symptoms of low blood glucose (hypoglycemia) and how to treat it. Your  risk for hypoglycemia increases during and after exercise. Common symptoms of hypoglycemia can include: ? Hunger. ? Anxiety. ?  Sweating and feeling clammy. ? Confusion. ? Dizziness or feeling light-headed. ? Increased heart rate or palpitations. ? Blurry vision. ? Tingling or numbness around the mouth, lips, or tongue. ? Tremors or shakes. ? Irritability.  Keep a rapid-acting carbohydrate snack available before, during, and after exercise to help prevent or treat hypoglycemia.  Avoid injecting insulin into areas of the body that are going to be exercised. For example, avoid injecting insulin into: ? The arms, when playing tennis. ? The legs, when jogging.  Keep records of your exercise habits. Doing this can help you and your health care provider adjust your diabetes management plan as needed. Write down: ? Food that you eat before and after you exercise. ? Blood glucose levels before and after you exercise. ? The type and amount of exercise you have done. ? When your insulin is expected to peak, if you use insulin. Avoid exercising at times when your insulin is peaking.  When you start a new exercise or activity, work with your health care provider to make sure the activity is safe for you, and to adjust your insulin, medicines, or food intake as needed.  Drink plenty of water while you exercise to prevent dehydration or heat stroke. Drink enough fluid to keep your urine clear or pale yellow. This information is not intended to replace advice given to you by your health care provider. Make sure you discuss any questions you have with your health care provider. Document Released: 01/08/2004 Document Revised: 05/07/2016 Document Reviewed: 03/29/2016 Elsevier Interactive Patient Education  2018 [employer]. Diabetes Mellitus and Food It is important for you to manage your blood sugar (glucose) level. Your blood glucose level can be greatly affected by what you eat. Eating healthier  foods in the appropriate amounts throughout the day at about the same time each day will help you control your blood glucose level. It can also help slow or prevent worsening of your diabetes mellitus. Healthy eating may even help you improve the level of your blood pressure and reach or maintain a healthy weight. General recommendations for healthful eating and cooking habits include:  Eating meals and snacks regularly. Avoid going long periods of time without eating to lose weight.  Eating a diet that consists mainly of plant-based foods, such as fruits, vegetables, nuts, legumes, and whole grains.  Using low-heat cooking methods, such as baking, instead of high-heat cooking methods, such as deep frying.  Work with your dietitian to make sure you understand how to use the Nutrition Facts information on food labels. How can food affect me? Carbohydrates Carbohydrates affect your blood glucose level more than any other type of food. Your dietitian will help you determine how many carbohydrates to eat at each meal and teach you how to count carbohydrates. Counting carbohydrates is important to keep your blood glucose at a healthy level, especially if you are using insulin or taking certain medicines for diabetes mellitus. Alcohol Alcohol can cause sudden decreases in blood glucose (hypoglycemia), especially if you use insulin or take certain medicines for diabetes mellitus. Hypoglycemia can be a life-threatening condition. Symptoms of hypoglycemia (sleepiness, dizziness, and disorientation) are similar to symptoms of having too much alcohol. If your health care provider has given you approval to drink alcohol, do so in moderation and use the following guidelines:  Women should not have more than one drink per day, and men should not have more than two drinks per day. One drink is equal to: ? 12 oz of beer. ?  5 oz of wine. ? 1 oz of hard liquor.  Do not drink on an empty stomach.  Keep  yourself hydrated. Have water, diet soda, or unsweetened iced tea.  Regular soda, juice, and other mixers might contain a lot of carbohydrates and should be counted.  What foods are not recommended? As you make food choices, it is important to remember that all foods are not the same. Some foods have fewer nutrients per serving than other foods, even though they might have the same number of calories or carbohydrates. It is difficult to get your body what it needs when you eat foods with fewer nutrients. Examples of foods that you should avoid that are high in calories and carbohydrates but low in nutrients include:  Trans fats (most processed foods list trans fats on the Nutrition Facts label).  Regular soda.  Juice.  Candy.  Sweets, such as cake, pie, doughnuts, and cookies.  Fried foods.  What foods can I eat? Eat nutrient-rich foods, which will nourish your body and keep you healthy. The food you should eat also will depend on several factors, including:  The calories you need.  The medicines you take.  Your weight.  Your blood glucose level.  Your blood pressure level.  Your cholesterol level.  You should eat a variety of foods, including:  Protein. ? Lean cuts of meat. ? Proteins low in saturated fats, such as fish, egg whites, and beans. Avoid processed meats.  Fruits and vegetables. ? Fruits and vegetables that may help control blood glucose levels, such as apples, mangoes, and yams.  Dairy products. ? Choose fat-free or low-fat dairy products, such as milk, yogurt, and cheese.  Grains, bread, pasta, and rice. ? Choose whole grain products, such as multigrain bread, whole oats, and brown rice. These foods may help control blood pressure.  Fats. ? Foods containing healthful fats, such as nuts, avocado, olive oil, canola oil, and fish.  Does everyone with diabetes mellitus have the same meal plan? Because every person with diabetes mellitus is different,  there is not one meal plan that works for everyone. It is very important that you meet with a dietitian who will help you create a meal plan that is just right for you. This information is not intended to replace advice given to you by your health care provider. Make sure you discuss any questions you have with your health care provider. Document Released: 07/15/2005 Document Revised: 03/25/2016 Document Reviewed: 09/14/2013 Elsevier Interactive Patient Education  2017 [place] for Eating Away From Home If You Have Diabetes Controlling your level of blood glucose, also known as blood sugar, can be challenging. It can be even more difficult when you do not prepare your own meals. The following tips can help you manage your diabetes when you eat away from home. Planning ahead Plan ahead if you know you will be eating away from home:  Ask your health care provider how to time meals and medicine if you are taking insulin.  Make a list of restaurants near you that offer healthy choices. If they have a carry-out menu, take it home and plan what you will order ahead of time.  Look up the restaurant you want to eat at online. Many chain and fast-food restaurants list nutritional information online. Use this information to choose the healthiest options and to calculate how many carbohydrates will be in your meal.  Use a carbohydrate-counting book or mobile app to look up the carbohydrate content  and serving size of the foods you want to eat.  Become familiar with serving sizes and learn to recognize how many servings are in a portion. This will allow you to estimate how many carbohydrates you can eat.  Free foods A "free food" is any food or drink that has less than 5 g of carbohydrates per serving. Free foods include:  Many vegetables.  Hard boiled eggs.  Nuts or seeds.  Olives.  Cheeses.  Meats.  These types of foods make good appetizer choices and are often available at salad  bars. Lemon juice, vinegar, or a low-calorie salad dressing of fewer than 20 calories per serving can be used as a "free" salad dressing. Choices to reduce carbohydrates  Substitute nonfat sweetened yogurt with a sugar-free yogurt. Yogurt made from soy milk may also be used, but you will still want a sugar-free or plain option to choose a lower carbohydrate amount.  Ask your server to take away the bread basket or chips from your table.  Order fresh fruit. A salad bar often offers fresh fruit choices. Avoid canned fruit because it is usually packed in sugar or syrup.  Order a salad, and eat it without dressing. Or, create a "free" salad dressing.  Ask for substitutions. For example, instead of [place] fries, request an order of a vegetable such as salad, green beans, or broccoli. Other tips  If you take insulin, take the insulin once your food arrives to your table. This will ensure your insulin and food are timed correctly.  Ask your server about the portion size before your order, and ask for a take-out box if the portion has more servings than you should have. When your food comes, leave the amount you should have on the plate, and put the rest in the take-out box.  Consider splitting an entree with someone and ordering a side salad. This information is not intended to replace advice given to you by your health care provider. Make sure you discuss any questions you have with your health care provider. Document Released: 10/18/2005 Document Revised: 03/25/2016 Document Reviewed: 01/15/2014 Elsevier Interactive Patient Education  2018 [employer].  Type 2 Diabetes Mellitus, Diagnosis, Adult Type 2 diabetes (type 2 diabetes mellitus) is a long-term (chronic) disease. In type 2 diabetes, one or both of these problems may be present:  The pancreas does not make enough of a hormone called insulin.  Cells in the body do not respond properly to insulin that the body makes (insulin  resistance).  Normally, insulin allows blood sugar (glucose) to enter cells in the body. The cells use glucose for energy. Insulin resistance or lack of insulin causes excess glucose to build up in the blood instead of going into cells. As a result, high blood glucose (hyperglycemia) develops. What increases the risk? The following factors may make you more likely to develop type 2 diabetes:  Having a family member with type 2 diabetes.  Being overweight or obese.  Having an inactive (sedentary) lifestyle.  Having been diagnosed with insulin resistance.  Having a history of prediabetes, gestational diabetes, or polycystic ovarian syndrome (PCOS).  Being of American-Indian, African-American, Hispanic/Latino, or Asian/Pacific Islander descent.  What are the signs or symptoms? In the early stage of this condition, you may not have symptoms. Symptoms develop slowly and may include:  Increased thirst (polydipsia).  Increased hunger(polyphagia).  Increased urination (polyuria).  Increased urination during the night (nocturia).  Unexplained weight loss.  Frequent infections that keep coming back (recurring).  Fatigue.  Weakness.  Vision changes, such as blurry vision.  Cuts or bruises that are slow to heal.  Tingling or numbness in the hands or feet.  Dark patches on the skin (acanthosis nigricans).  How is this diagnosed?  This condition is diagnosed based on your symptoms, your medical history, a physical exam, and your blood glucose level. Your blood glucose may be checked with one or more of the following blood tests:  A fasting blood glucose (FBG) test. You will not be allowed to eat (you will fast) for at least 8 hours before a blood sample is taken.  A random blood glucose test. This checks blood glucose at any time of day regardless of when you ate.  An A1c (hemoglobin A1c) blood test. This provides information about blood glucose control over the previous 2-3  months.  An oral glucose tolerance test (OGTT). This measures your blood glucose at two times: ? After fasting. This is your baseline blood glucose level. ? Two hours after drinking a beverage that contains glucose.  You may be diagnosed with type 2 diabetes if:  Your FBG level is 126 mg/dL (7.0 mmol/L) or higher.  Your random blood glucose level is 200 mg/dL (11.1 mmol/L) or higher.  Your A1c level is 6.5% or higher.  Your OGGT result is higher than 200 mg/dL (11.1 mmol/L).  These blood tests may be repeated to confirm your diagnosis. How is this treated?  Your treatment may be managed by a specialist called an endocrinologist. Type 2 diabetes may be treated by following instructions from your health care provider about:  Making diet and lifestyle changes. This may include: ? Following an individualized nutrition plan that is developed by a diet and nutrition specialist (registered dietitian). ? Exercising regularly. ? Finding ways to manage stress.  Checking your blood glucose level as often as directed.  Taking diabetes medicines or insulin daily. This helps to keep your blood glucose levels in the healthy range. ? If you use insulin, you may need to adjust the dosage depending on how physically active you are and what foods you eat. Your health care provider will tell you how to adjust your dosage.  Taking medicines to help prevent complications from diabetes, such as: ? Aspirin. ? Medicine to lower cholesterol. ? Medicine to control blood pressure.  Your health care provider will set individualized treatment goals for you. Your goals will be based on your age, other medical conditions you have, and how you respond to diabetes treatment. Generally, the goal of treatment is to maintain the following blood glucose levels:  Before meals (preprandial): 80-130 mg/dL (4.4-7.2 mmol/L).  After meals (postprandial): below 180 mg/dL (10 mmol/L).  A1c level: less than 7%.  Follow  these instructions at home: Questions to [place] Provider Consider asking the following questions:  Do I need to meet with a diabetes educator?  Where can I find a support group for people with diabetes?  What equipment will I need to manage my diabetes at home?  What diabetes medicines do I need, and when should I take them?  How often do I need to check my blood glucose?  What number can I call if I have questions?  When is my next appointment?  General instructions  Take over-the-counter and prescription medicines only as told by your health care provider.  Keep all follow-up visits as told by your health care provider. This is important.  For more information about diabetes, visit: ? American  Diabetes Association (ADA): www.diabetes.org ? American Association of Diabetes Educators (AADE): www.diabeteseducator.org/patient-resources Contact a health care provider if:  Your blood glucose is at or above 240 mg/dL (13.3 mmol/L) for 2 days in a row.  You have been sick or have had a fever for 2 days or longer and you are not getting better.  You have any of the following problems for more than 6 hours: ? You cannot eat or drink. ? You have nausea and vomiting. ? You have diarrhea. Get help right away if:  Your blood glucose is lower than 54 mg/dL (3.0 mmol/L).  You become confused or you have trouble thinking clearly.  You have difficulty breathing.  You have moderate or large ketone levels in your urine. This information is not intended to replace advice given to you by your health care provider. Make sure you discuss any questions you have with your health care provider. Document Released: 10/18/2005 Document Revised: 03/25/2016 Document Reviewed: 11/21/2015 Elsevier Interactive Patient Education  2017 [employer] Inc.  Type 2 Diabetes Mellitus, Self Care, Adult Caring for yourself after you have been diagnosed with type 2 diabetes (type 2 diabetes  mellitus) means keeping your blood sugar (glucose) under control with a balance of:  Nutrition.  Exercise.  Lifestyle changes.  Medicines or insulin, if necessary.  Support from your team of health care providers and others.  The following information explains what you need to know to manage your diabetes at home. What do I need to do to manage my blood glucose?  Check your blood glucose every day, as often as told by your health care provider.  Contact your health care provider if your blood glucose is above your target for 2 tests in a row.  Have your A1c (hemoglobin A1c) level checked at least two times a year, or as often as told by your health care provider. Your health care provider will set individualized treatment goals for you. Generally, the goal of treatment is to maintain the following blood glucose levels:  Before meals (preprandial): 80-130 mg/dL (4.4-7.2 mmol/L).  After meals (postprandial): below 180 mg/dL (10 mmol/L).  A1c level: less than 7%.  What do I need to know about hyperglycemia and hypoglycemia? What is hyperglycemia? Hyperglycemia, also called high blood glucose, occurs when blood glucose is too high.Make sure you know the early signs of hyperglycemia, such as:  Increased thirst.  Hunger.  Feeling very tired.  Needing to urinate more often than usual.  Blurry vision.  What is hypoglycemia? Hypoglycemia, also called low blood glucose, occurswith a blood glucose level at or below 70 mg/dL (3.9 mmol/L). The risk for hypoglycemia increases during or after exercise, during sleep, during illness, and when skipping meals or not eating for a long time (fasting). It is important to know the symptoms of hypoglycemia and treat it right away. Always have a 15-gram rapid-acting carbohydrate snack with you to treat low blood glucose. Family members and close friends should also know the symptoms and should understand how to treat hypoglycemia, in case you  are not able to treat yourself. What are the symptoms of hypoglycemia? Hypoglycemia symptoms can include:  Hunger.  Anxiety.  Sweating and feeling clammy.  Confusion.  Dizziness or feeling light-headed.  Sleepiness.  Nausea.  Increased heart rate.  Headache.  Blurry vision.  Seizure.  Nightmares.  Tingling or numbness around the mouth, lips, or tongue.  A change in speech.  Decreased ability to concentrate.  A change in coordination.  Restless sleep.  Tremors or shakes.  Fainting.  Irritability.  How do I treat hypoglycemia?  If you are alert and able to swallow safely, follow the 15:15 rule:  Take 15 grams of a rapid-acting carbohydrate. Rapid-acting options include: ? 1 tube of glucose gel. ? 3 glucose pills. ? 6-8 pieces of hard candy. ? 4 oz (120 mL) of fruit juice. ? 4 oz (120 mL) of regular (not diet) soda.  Check your blood glucose 15 minutes after you take the carbohydrate.  If the repeat blood glucose level is still at or below 70 mg/dL (3.9 mmol/L), take 15 grams of a carbohydrate again.  If your blood glucose level does not increase above 70 mg/dL (3.9 mmol/L) after 3 tries, seek emergency medical care.  After your blood glucose level returns to normal, eat a meal or a snack within 1 hour.  How do I treat severe hypoglycemia? Severe hypoglycemia is when your blood glucose level is at or below 54 mg/dL (3 mmol/L). Severe hypoglycemia is an emergency. Do not wait to see if the symptoms will go away. Get medical help right away. Call your local emergency services (911 in the [place] Do not drive yourself to the hospital. If you have severe hypoglycemia and you cannot eat or drink, you may need an injection of glucagon. A family member or close friend should learn how to check your blood glucose and how to give you a glucagon injection. Ask your health care provider if you need to have an emergency glucagon injection kit available. Severe  hypoglycemia may need to be treated in a hospital. The treatment may include getting glucose through an IV tube. You may also need treatment for the cause of your hypoglycemia. Can having diabetes put me at risk for other conditions? Having diabetes can put you at risk for other long-term (chronic) conditions, such as heart disease and kidney disease. Your health care provider may prescribe medicines to help prevent complications from diabetes. These medicines may include:  Aspirin.  Medicine to lower cholesterol.  Medicine to control blood pressure.  What else can I do to manage my diabetes? Take your diabetes medicines as told  If your health care provider prescribed insulin or diabetes medicines, take them every day.  Do not run out of insulin or other diabetes medicines that you take. Plan ahead so you always have these available.  If you use insulin, adjust your dosage based on how physically active you are and what foods you eat. Your health care provider will tell you how to adjust your dosage. Make healthy food choices  The things that you eat and drink affect your blood glucose and your insulin dosage. Making good choices helps to control your diabetes and prevent other health problems. A healthy meal plan includes eating lean proteins, complex carbohydrates, fresh fruits and vegetables, low-fat dairy products, and healthy fats. Make an appointment to see a diet and nutrition specialist (registered dietitian) to help you create an eating plan that is right for you. Make sure that you:  Follow instructions from your health care provider about eating or drinking restrictions.  Drink enough fluid to keep your urine clear or pale yellow.  Eat healthy snacks between nutritious meals.  Track the carbohydrates that you eat. Do this by reading food labels and learning the standard serving sizes of foods.  Follow your sick day plan whenever you cannot eat or drink as usual. Make this  plan in advance with your health care provider.  Stay active  Exercise regularly, as told by your health care provider. This may include:  Stretching and doing strength exercises, such as yoga or weightlifting, at least 2 times a week.  Doing at least 150 minutes of moderate-intensity or vigorous-intensity exercise each week. This could be brisk walking, biking, or water aerobics. ? Spread out your activity over at least 3 days of the week. ? Do not go more than 2 days in a row without doing some kind of physical activity.  When you start a new exercise or activity, work with your health care provider to adjust your insulin, medicines, or food intake as needed. Make healthy lifestyle choices  Do not use any tobacco products, such as cigarettes, chewing tobacco, and e-cigarettes. If you need help quitting, ask your health care provider.  If your health care provider says that alcohol is safe for you, limit alcohol intake to no more than 1 drink per day for nonpregnant women and 2 drinks per day for men. One drink equals 12 oz of beer, 5 oz of wine, or 1 oz of hard liquor.  Learn to manage stress. If you need help with this, ask your health care provider. Care for your body   Keep your immunizations up to date. In addition to getting vaccinations as told by your health care provider, it is recommended that you get vaccinated against the following illnesses: ? The flu (influenza). Get a flu shot every year. ? Pneumonia. ? Hepatitis B.  Schedule an eye exam soon after your diagnosis, and then one time every year after that.  Check your skin and feet every day for cuts, bruises, redness, blisters, or sores. Schedule a foot exam with your health care provider once every year.  Brush your teeth and gums two times a day, and floss at least one time a day. Visit your dentist at least once every 6 months.  Maintain a healthy weight. General instructions  Take over-the-counter and  prescription medicines only as told by your health care provider.  Share your diabetes management plan with people in your workplace, school, and household.  Check your urine for ketones when you are ill and as told by your health care provider.  Ask your health care provider: ? Do I need to meet with a diabetes educator? ? Where can I find a support group for people with diabetes?  Carry a medical alert card or wear medical alert jewelry.  Keep all follow-up visits as told by your health care provider. This is important. Where to find more information: For more information about diabetes, visit:  American Diabetes Association (ADA): www.diabetes.org  American Association of Diabetes Educators (AADE): www.diabeteseducator.org/patient-resources  This information is not intended to replace advice given to you by your health care provider. Make sure you discuss any questions you have with your health care provider. Document Released: 02/09/2016 Document Revised: 03/25/2016 Document Reviewed: 11/21/2015 Elsevier Interactive Patient Education  2017 [employer].

## 2017-10-11 ENCOUNTER — Other Ambulatory Visit: Payer: Self-pay | Admitting: Internal Medicine

## 2018-02-16 ENCOUNTER — Other Ambulatory Visit: Payer: Self-pay | Admitting: Internal Medicine

## 2018-03-21 ENCOUNTER — Ambulatory Visit: Payer: Self-pay | Admitting: Internal Medicine

## 2018-03-22 ENCOUNTER — Ambulatory Visit: Payer: Self-pay | Admitting: Internal Medicine

## 2018-03-29 ENCOUNTER — Ambulatory Visit: Payer: Self-pay | Admitting: Internal Medicine

## 2018-04-04 ENCOUNTER — Ambulatory Visit: Payer: Self-pay | Admitting: Internal Medicine

## 2018-04-04 DIAGNOSIS — Z0289 Encounter for other administrative examinations: Secondary | ICD-10-CM

## 2018-07-31 ENCOUNTER — Other Ambulatory Visit: Payer: Self-pay | Admitting: Internal Medicine

## 2018-08-01 NOTE — Telephone Encounter (Signed)
Pt aware via vm to follow up with new PCP for further refills due to Dr.Kwiakowski's retiring. Rx filled for 3 months.

## 2018-11-02 ENCOUNTER — Other Ambulatory Visit: Payer: Self-pay | Admitting: Internal Medicine

## 2018-11-02 NOTE — Telephone Encounter (Signed)
Copied from CRM [id number]. Topic: Quick Communication - Rx Refill/Question >> Nov 02, 2018 10:11 AM Jolayne Haines L wrote: Medication: ARMOUR THYROID 120 MG tablet  Has the patient contacted their pharmacy? Pharmacy is the caller (Agent: If no, request that the patient contact the pharmacy for the refill.) (Agent: If yes, when and what did the pharmacy advise?)  Preferred Pharmacy (with phone number or street name): [id number] [place], [place] - [address] AT [facility] [address] [phone] [address] [place] [address]  Former patient of Dr Kirtland Bouchard, has not established with a new PCP    Agent: Please be advised that RX refills may take up to 3 business days. We ask that you follow-up with your pharmacy.
